# Patient Record
Sex: Female | Born: 1955 | Race: Black or African American | Hispanic: No | Marital: Single | Smoking: Former smoker
Health system: Southern US, Community
[De-identification: ages and names within clinical notes are randomized; demographics above are authoritative.]

## PROBLEM LIST (undated history)

## (undated) DIAGNOSIS — E785 Hyperlipidemia, unspecified: Secondary | ICD-10-CM

## (undated) DIAGNOSIS — J45909 Unspecified asthma, uncomplicated: Secondary | ICD-10-CM

## (undated) DIAGNOSIS — R1310 Dysphagia, unspecified: Secondary | ICD-10-CM

## (undated) DIAGNOSIS — K219 Gastro-esophageal reflux disease without esophagitis: Secondary | ICD-10-CM

## (undated) DIAGNOSIS — J449 Chronic obstructive pulmonary disease, unspecified: Secondary | ICD-10-CM

## (undated) DIAGNOSIS — F329 Major depressive disorder, single episode, unspecified: Secondary | ICD-10-CM

## (undated) DIAGNOSIS — N390 Urinary tract infection, site not specified: Secondary | ICD-10-CM

## (undated) DIAGNOSIS — E079 Disorder of thyroid, unspecified: Secondary | ICD-10-CM

## (undated) DIAGNOSIS — G8250 Quadriplegia, unspecified: Secondary | ICD-10-CM

## (undated) DIAGNOSIS — K3184 Gastroparesis: Secondary | ICD-10-CM

## (undated) DIAGNOSIS — I509 Heart failure, unspecified: Secondary | ICD-10-CM

## (undated) DIAGNOSIS — F419 Anxiety disorder, unspecified: Secondary | ICD-10-CM

## (undated) DIAGNOSIS — E119 Type 2 diabetes mellitus without complications: Secondary | ICD-10-CM

## (undated) DIAGNOSIS — D649 Anemia, unspecified: Secondary | ICD-10-CM

## (undated) HISTORY — PX: OTHER SURGICAL HISTORY: SHX169

---

## 2003-06-21 ENCOUNTER — Other Ambulatory Visit: Payer: Self-pay

## 2005-01-09 ENCOUNTER — Emergency Department: Payer: Self-pay | Admitting: Emergency Medicine

## 2005-02-11 ENCOUNTER — Inpatient Hospital Stay: Payer: Self-pay | Admitting: Internal Medicine

## 2005-12-04 ENCOUNTER — Other Ambulatory Visit: Payer: Self-pay

## 2005-12-04 ENCOUNTER — Emergency Department: Payer: Self-pay | Admitting: Emergency Medicine

## 2006-08-02 ENCOUNTER — Inpatient Hospital Stay: Payer: Self-pay | Admitting: Internal Medicine

## 2007-10-20 ENCOUNTER — Emergency Department: Payer: Self-pay | Admitting: Emergency Medicine

## 2008-11-23 ENCOUNTER — Encounter: Payer: Self-pay | Admitting: Internal Medicine

## 2008-11-23 ENCOUNTER — Ambulatory Visit: Payer: Self-pay | Admitting: Internal Medicine

## 2008-12-03 ENCOUNTER — Ambulatory Visit: Payer: Self-pay | Admitting: Internal Medicine

## 2008-12-14 ENCOUNTER — Encounter: Payer: Self-pay | Admitting: Internal Medicine

## 2009-01-13 ENCOUNTER — Encounter: Payer: Self-pay | Admitting: Internal Medicine

## 2009-02-14 ENCOUNTER — Ambulatory Visit: Payer: Self-pay | Admitting: Internal Medicine

## 2009-03-08 ENCOUNTER — Ambulatory Visit: Payer: Self-pay | Admitting: Internal Medicine

## 2009-03-24 ENCOUNTER — Ambulatory Visit: Payer: Self-pay | Admitting: Internal Medicine

## 2009-04-21 ENCOUNTER — Ambulatory Visit: Payer: Self-pay | Admitting: Internal Medicine

## 2010-11-19 ENCOUNTER — Emergency Department: Payer: Self-pay | Admitting: Emergency Medicine

## 2011-01-16 ENCOUNTER — Inpatient Hospital Stay: Payer: Self-pay | Admitting: Internal Medicine

## 2011-05-05 ENCOUNTER — Inpatient Hospital Stay: Payer: Self-pay | Admitting: Internal Medicine

## 2011-07-11 ENCOUNTER — Inpatient Hospital Stay: Payer: Self-pay | Admitting: Specialist

## 2011-07-17 LAB — CBC WITH DIFFERENTIAL/PLATELET
Basophil %: 0.3 %
Eosinophil %: 0.2 %
HGB: 10.9 g/dL — ABNORMAL LOW (ref 12.0–16.0)
Lymphocyte #: 1.2 10*3/uL (ref 1.0–3.6)
Lymphocyte %: 16.2 %
MCH: 29.5 pg (ref 26.0–34.0)
MCHC: 33 g/dL (ref 32.0–36.0)
Monocyte #: 0.7 10*3/uL (ref 0.0–0.7)
Monocyte %: 9.3 %
Neutrophil #: 5.3 10*3/uL (ref 1.4–6.5)
Neutrophil %: 74 %
RBC: 3.7 10*6/uL — ABNORMAL LOW (ref 3.80–5.20)
RDW: 16.3 % — ABNORMAL HIGH (ref 11.5–14.5)

## 2011-07-17 LAB — BASIC METABOLIC PANEL
Anion Gap: 8 (ref 7–16)
Chloride: 97 mmol/L — ABNORMAL LOW (ref 98–107)
Co2: 31 mmol/L (ref 21–32)
Creatinine: 0.75 mg/dL (ref 0.60–1.30)
Osmolality: 272 (ref 275–301)
Potassium: 3 mmol/L — ABNORMAL LOW (ref 3.5–5.1)

## 2011-07-18 LAB — TROPONIN I: Troponin-I: <0.02 ng/mL

## 2011-07-19 LAB — CBC WITH DIFFERENTIAL/PLATELET
Basophil #: 0.1 10*3/uL (ref 0.0–0.1)
Basophil %: 0.2 %
Basophil %: 0.5 %
Eosinophil #: 0 10*3/uL (ref 0.0–0.7)
Eosinophil %: 0 %
Eosinophil %: 0 %
HCT: 34.9 % — ABNORMAL LOW (ref 35.0–47.0)
HGB: 11 g/dL — ABNORMAL LOW (ref 12.0–16.0)
Lymphocyte %: 1.9 %
Lymphocyte %: 2.6 %
MCH: 29.7 pg (ref 26.0–34.0)
MCHC: 33.3 g/dL (ref 32.0–36.0)
MCV: 89 fL (ref 80–100)
Monocyte #: 0.2 10*3/uL (ref 0.0–0.7)
Monocyte %: 1.5 %
Neutrophil #: 8.4 10*3/uL — ABNORMAL HIGH (ref 1.4–6.5)
Neutrophil %: 95.6 %
Neutrophil %: 95.7 %
Platelet: 270 10*3/uL (ref 150–440)
RBC: 3.91 10*6/uL (ref 3.80–5.20)
RDW: 16.2 % — ABNORMAL HIGH (ref 11.5–14.5)
WBC: 11.1 10*3/uL — ABNORMAL HIGH (ref 3.6–11.0)
WBC: 8.7 10*3/uL (ref 3.6–11.0)

## 2011-07-19 LAB — BASIC METABOLIC PANEL
Anion Gap: 11 (ref 7–16)
BUN: 10 mg/dL (ref 7–18)
Calcium, Total: 8.3 mg/dL — ABNORMAL LOW (ref 8.5–10.1)
Chloride: 87 mmol/L — ABNORMAL LOW (ref 98–107)
Co2: 32 mmol/L (ref 21–32)
Creatinine: 0.56 mg/dL — ABNORMAL LOW (ref 0.60–1.30)
Glucose: 134 mg/dL — ABNORMAL HIGH (ref 65–99)
Potassium: 4.5 mmol/L (ref 3.5–5.1)

## 2011-07-19 LAB — TROPONIN I
Troponin-I: <0.02 ng/mL
Troponin-I: <0.02 ng/mL

## 2011-07-20 LAB — BASIC METABOLIC PANEL
BUN: 15 mg/dL (ref 7–18)
Co2: 32 mmol/L (ref 21–32)
Creatinine: 0.69 mg/dL (ref 0.60–1.30)
EGFR (Non-African Amer.): >60
Glucose: 109 mg/dL — ABNORMAL HIGH (ref 65–99)
Osmolality: 266 (ref 275–301)
Sodium: 132 mmol/L — ABNORMAL LOW (ref 136–145)

## 2011-07-21 LAB — BASIC METABOLIC PANEL
BUN: 17 mg/dL (ref 7–18)
Calcium, Total: 8.7 mg/dL (ref 8.5–10.1)
Chloride: 88 mmol/L — ABNORMAL LOW (ref 98–107)
Creatinine: 0.76 mg/dL (ref 0.60–1.30)
EGFR (Non-African Amer.): >60
Glucose: 95 mg/dL (ref 65–99)
Osmolality: 264 (ref 275–301)
Potassium: 3.6 mmol/L (ref 3.5–5.1)
Sodium: 131 mmol/L — ABNORMAL LOW (ref 136–145)

## 2011-07-22 LAB — BASIC METABOLIC PANEL
Anion Gap: 9 (ref 7–16)
BUN: 14 mg/dL (ref 7–18)
Calcium, Total: 9.1 mg/dL (ref 8.5–10.1)
Chloride: 91 mmol/L — ABNORMAL LOW (ref 98–107)
Co2: 30 mmol/L (ref 21–32)
EGFR (African American): >60
EGFR (Non-African Amer.): >60
Glucose: 98 mg/dL (ref 65–99)
Osmolality: 261 (ref 275–301)
Potassium: 3.8 mmol/L (ref 3.5–5.1)

## 2011-07-26 LAB — EXPECTORATED SPUTUM ASSESSMENT W GRAM STAIN, RFLX TO RESP C

## 2011-10-01 ENCOUNTER — Ambulatory Visit: Payer: Self-pay | Admitting: Unknown Physician Specialty

## 2011-10-02 LAB — PATHOLOGY REPORT

## 2011-10-29 ENCOUNTER — Ambulatory Visit: Payer: Self-pay | Admitting: Unknown Physician Specialty

## 2011-10-29 LAB — CREATININE, SERUM
Creatinine: 0.74 mg/dL (ref 0.60–1.30)
EGFR (Non-African Amer.): >60

## 2012-01-08 ENCOUNTER — Ambulatory Visit: Payer: Self-pay | Admitting: Internal Medicine

## 2012-01-08 LAB — RETICULOCYTES
Absolute Retic Count: 0.0776 10*6/uL (ref 0.024–0.084)
Reticulocyte: 1.9 % — ABNORMAL HIGH (ref 0.5–1.5)

## 2012-01-08 LAB — CBC CANCER CENTER
Basophil #: 0 x10 3/mm (ref 0.0–0.1)
Basophil %: 1 %
Eosinophil %: 5 %
HCT: 37.6 % (ref 35.0–47.0)
HGB: 12.1 g/dL (ref 12.0–16.0)
MCH: 29.6 pg (ref 26.0–34.0)
MCHC: 32.2 g/dL (ref 32.0–36.0)
Neutrophil #: 2.6 x10 3/mm (ref 1.4–6.5)
Neutrophil %: 57.5 %
Platelet: 306 x10 3/mm (ref 150–440)
RBC: 4.08 10*6/uL (ref 3.80–5.20)
WBC: 4.5 x10 3/mm (ref 3.6–11.0)

## 2012-01-08 LAB — FOLATE: Folic Acid: 4.2 ng/mL (ref 3.1–100.0)

## 2012-01-08 LAB — LACTATE DEHYDROGENASE: LDH: 196 U/L (ref 84–246)

## 2012-01-08 LAB — IRON AND TIBC: Iron Saturation: 14 %

## 2012-01-14 ENCOUNTER — Ambulatory Visit: Payer: Self-pay | Admitting: Internal Medicine

## 2012-05-12 ENCOUNTER — Ambulatory Visit: Payer: Self-pay

## 2012-05-12 LAB — CREATININE, SERUM: Creatinine: 0.68 mg/dL (ref 0.60–1.30)

## 2013-01-24 ENCOUNTER — Emergency Department: Payer: Self-pay | Admitting: Emergency Medicine

## 2013-01-24 LAB — CBC
HCT: 32.6 % — ABNORMAL LOW (ref 35.0–47.0)
HGB: 10.9 g/dL — ABNORMAL LOW (ref 12.0–16.0)
MCH: 29.7 pg (ref 26.0–34.0)
MCHC: 33.3 g/dL (ref 32.0–36.0)
MCV: 89 fL (ref 80–100)
Platelet: 319 10*3/uL (ref 150–440)
RBC: 3.66 10*6/uL — ABNORMAL LOW (ref 3.80–5.20)
RDW: 14.6 % — ABNORMAL HIGH (ref 11.5–14.5)

## 2013-01-24 LAB — COMPREHENSIVE METABOLIC PANEL
Albumin: 3.3 g/dL — ABNORMAL LOW (ref 3.4–5.0)
Alkaline Phosphatase: 182 U/L — ABNORMAL HIGH (ref 50–136)
BUN: 4 mg/dL — ABNORMAL LOW (ref 7–18)
Co2: 16 mmol/L — ABNORMAL LOW (ref 21–32)
Potassium: 3 mmol/L — ABNORMAL LOW (ref 3.5–5.1)
SGPT (ALT): 24 U/L (ref 12–78)
Sodium: 139 mmol/L (ref 136–145)

## 2013-01-24 LAB — URINALYSIS, COMPLETE
Bilirubin,UR: NEGATIVE
Ketone: NEGATIVE
Ph: 6 (ref 4.5–8.0)
Specific Gravity: 1.004 (ref 1.003–1.030)

## 2013-03-23 IMAGING — CR DG CHEST 1V PORT
1 series · 1 of 1 positions shown · non-contrast
Comparison: none

REASON FOR EXAM: weakness
COMMENTS:

PROCEDURE:     DXR - DXR PORTABLE CHEST SINGLE VIEW  - May 05, 2011  [DATE]
RESULT:     Comparison: 01/19/2011

[view not recorded]
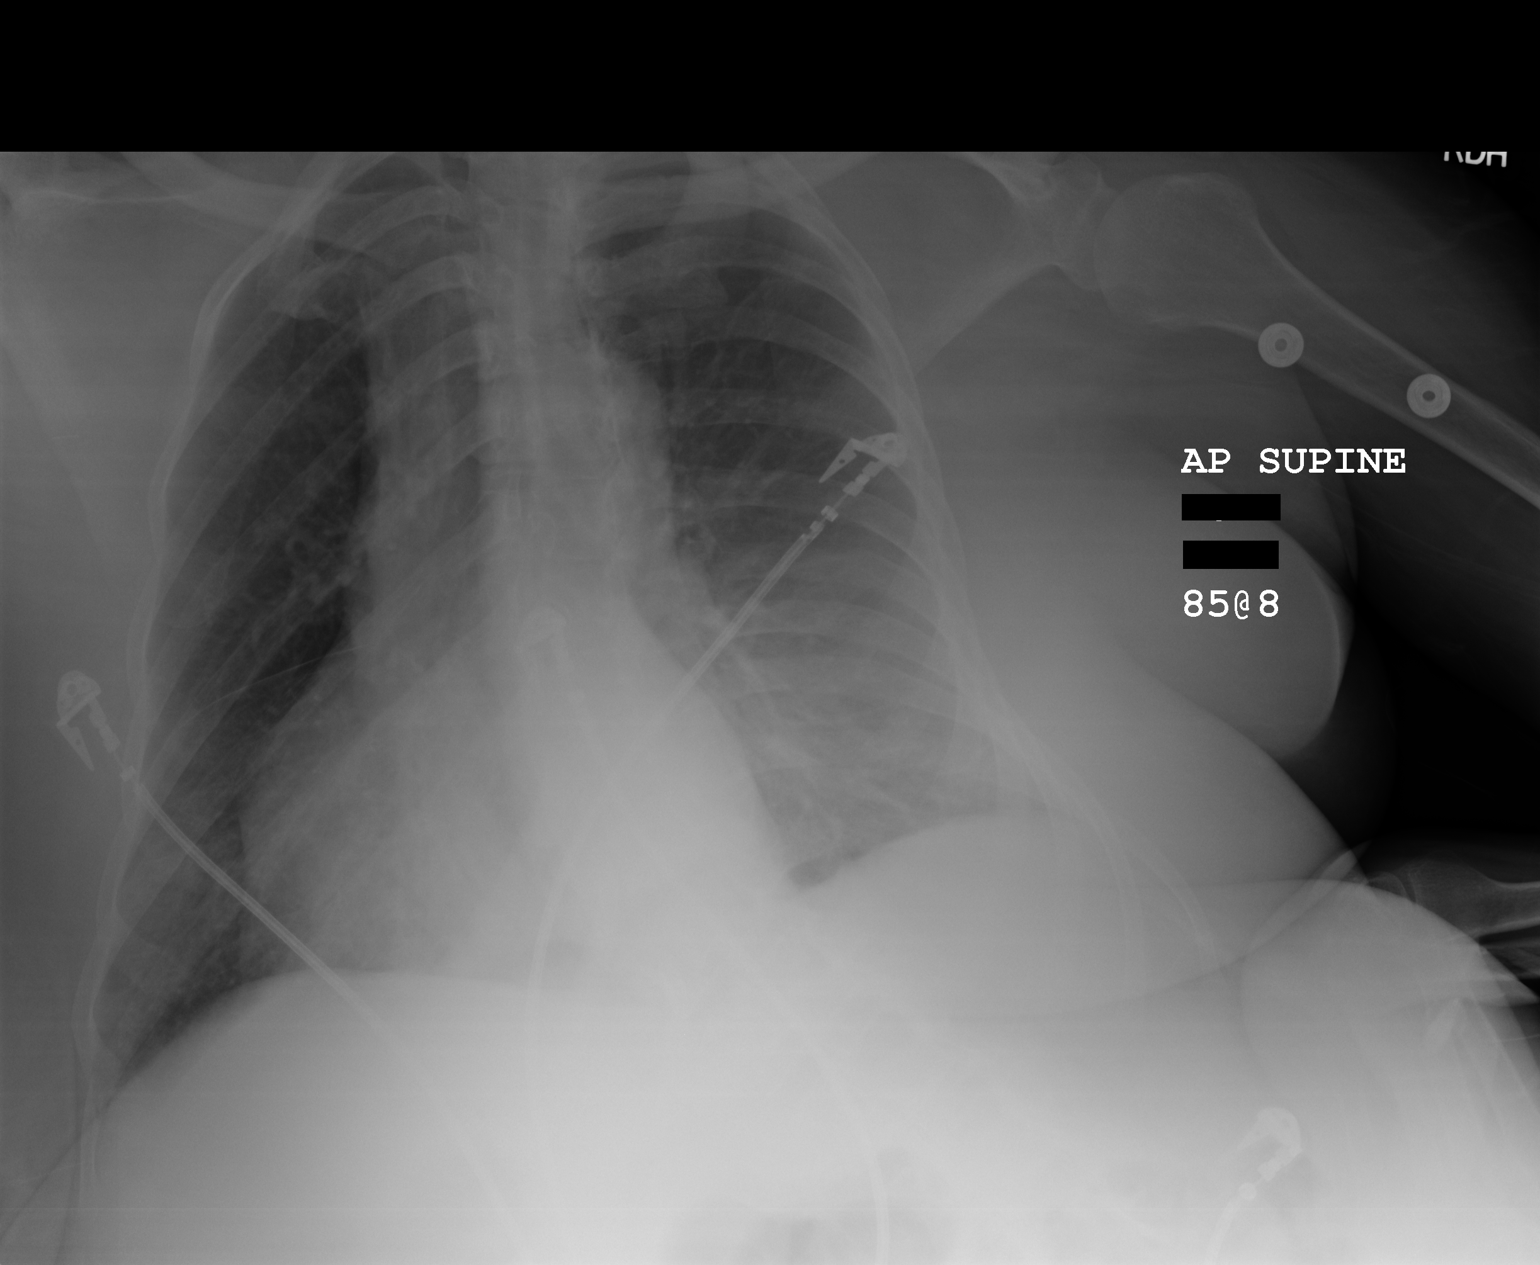

[1 of 1 positions shown; findings below may reference images not displayed]

FINDINGS: Evaluation limited by difficulty with patient positioning and patient
rotation. The heart and mediastinum are stable. Relative increased density
over the mid and lower left lung is felt to be secondary to the patient
rotation and overlying soft tissue. Otherwise, no focal pulmonary opacities
identified.
IMPRESSION: No acute cardiopulmonary disease.

## 2013-05-14 ENCOUNTER — Ambulatory Visit: Payer: Self-pay

## 2014-05-06 ENCOUNTER — Ambulatory Visit: Payer: Self-pay | Admitting: Internal Medicine

## 2014-11-07 NOTE — Consult Note (Signed)
Pt breathing better. no bleeding today, makes 3-4 days without passage of blood.  No bowel movement with laxative pills.  Will give Fleets tonight.. I will sign off, reconsult if needed.  Electronic Signatures: Scot JunElliott, Robert T (MD)  (Signed on 04-Jan-13 19:10)  Authored  Last Updated: 04-Jan-13 19:10 by Scot JunElliott, Robert T (MD)

## 2014-11-07 NOTE — Discharge Summary (Signed)
PATIENT NAME:  Alexis [name], Alexis [name] MR#:  [id number] DATE OF BIRTH:  06-23-1956  DATE OF ADMISSION:  07/11/2011 DATE OF DISCHARGE:  07/23/2011  HISTORY AND PHYSICAL: For a detailed note, please take a look at the History and Physical done by Dr. Auburn BilberryShreyang Patel on admission. Please also look at the detailed Interim Discharge Summary done by Dr. Katharina Caperima Vaickute which covers the hospital course from 07/11/2011 until 07/20/2011. This covers the hospital course from January 4th onwards to the day of discharge, 07/23/2011.   DISCHARGE DIAGNOSES:  1. Anemia secondary to a lower gastrointestinal bleed, now resolved, suspected aspiration pneumonia.  2. Quadriplegia.  3. Obstipation/constipation, chronic for the patient's chronic obstructive pulmonary disease.  4. Congestive heart failure, diastolic dysfunction.   DIET: The patient is being discharged on a mechanical soft diet. The patient is discouraged from taking multiple gulping sips when drinking, 2 to 3 small sips, fewer if necessary, small single bites when feeding, feed slowly. The patient is to sit fully upright when taking p.o.s and remain inclined post meals for at least 45 minutes. The patient is not to lie down after meals.   FOLLOW-UP: Follow up with Dr. Terance HartBronstein in the next 1 to 2 weeks.   DISCHARGE MEDICATIONS:  1. Mucinex 2 tabs every 12 hours.  2. Claritin 10 mg daily.  3. Trazodone 150 mg at bedtime.  4. Fluticasone two sprays to nostril daily.  5. Colace 100 mg b.i.d.  6. Fleet's enema on Mondays, Wednesdays, Fridays.  7. Amitiza  24 mcg, 1 cap b.i.d.  8. Baclofen 10 mg t.i.d.  9. Advair 250/50, 1 puff b.i.d.  10. Oxybutynin 15 mg, 1 tab b.i.d.  11. Bupropion 100 mg, 2 tabs daily.  12. Reglan 10 mg q.i.d. and at bedtime.  13. Zofran 4 mg every 6 hours.  14. Artificial Tears 1 drop to both eyes daily.  15. Ambien 5 mg at bedtime.  16. Omeprazole 40 mg b.i.d.  17. Lasix 20 mg daily.  18. Potassium 20 mEq daily.  19. Augmentin 5 mg  b.i.d. x5 days. 20. Lisinopril 5 mg daily.   CONSULTANTS DURING THE HOSPITAL COURSE: Dr. Kerrin MoBob Elliott from Gastroenterology.   HOSPITAL COURSE: Again, please look at the Interim Discharge Summary done by Dr. Winona LegatoVaickute which covers an extensive hospital course from admission on December 26th until January 4th.  This is only a short interim course.   1. Acute posthemorrhagic anemia: This was thought to be secondary to a GI bleed, likely a lower GI bleed, although the source was unclear. The patient was on Lovenox for deep vein thrombosis prophylaxis when she came. This was discontinued, and the patient was transfused 2 units of packed red blood cells. Hemoglobin has improved since then. She has had no evidence of any further bleeding and hemoglobin is currently stable. She has been able to tolerate p.o. without any worsening anemia.  2. Acute respiratory failure: This was thought to be related to a suspected aspiration pneumonia, also mild underlying congestive heart failure. The patient initially received broad-spectrum antibiotics with Zosyn, Zyvox and Zithromax . Those were narrowed down to now p.o. Augmentin which she will continue for the next five days for treatment for her pneumonia. The patient was also given some diuretics and will resume a low-dose Lasix at 20 mg daily.  3. Chronic obstructive pulmonary disease exacerbation: This was also part of her respiratory failure. She was on triple antibiotic therapy with Zosyn, Zyvox and Zithromax , is now back to baseline. She  will continue her nebulizer treatments and also her Augmentin for her aspiration pneumonia.  4. History of congestive heart failure: This is secondary to diastolic dysfunction. Echocardiogram recently showed a normal LV function, normal valvular abnormalities. She will continue Lasix and maintenance lisinopril for her heart failure.  5. Hyponatremia: This seems to be chronic for the patient.  Her sodium, although, has improved and is  in stable at around 130.  6. Dysphagia: The patient underwent a modified barium swallow which showed no significant dysphagia or reflux. The problem was the patient taking large gulps of fluid and quantities of food. She was placed on small frequent meals and told to take small sips, which seems to have improved the dysphagia.   7. Quadriplegia: This is chronic for the patient. She is on baclofen, which she will continue.  8. Constipation/obstipation: This is secondary to a functional quadriplegia. The patient normally takes Amitiza gets Fleet's enema 3 times a week. She will resume those as this seems to be a chronic problem for the patient. Her last bowel movement was January 4th after she received a Fleet's enema. She has received one today, and this further needs to be followed up.   CODE STATUS:  The patient is a FULL CODE.     TIME SPENT: 40 minutes.   ____________________________ Rolly Pancake. Cherlynn Kaiser, MD vjs:cbb D: 07/23/2011 15:17:28 ET T: 07/23/2011 15:38:22 ET JOB#: [id number]  cc: Rolly Pancake. Cherlynn Kaiser, MD, <Dictator> Teena Irani. Terance Hart, MD [place] Siren MD ELECTRONICALLY SIGNED 07/23/2011 16:57

## 2014-11-07 NOTE — Consult Note (Signed)
Chief Complaint:   Subjective/Chief Complaint 2 bm since yesterday with no reported bleeding. denies abdominal pain.  has some mild nausea but is tolerating clears.   VITAL SIGNS/ANCILLARY NOTES: **Vital Signs.:   01-Jan-13 09:57   Temperature Temperature (F) 97.6   Celsius 36.4   Temperature Source oral   Pulse Pulse 65   Pulse source per Dinamap   Respirations Respirations 18   Systolic BP Systolic BP 509   Diastolic BP (mmHg) Diastolic BP (mmHg) 76   Mean BP 93   BP Source Dinamap   Pulse Ox % Pulse Ox % 99   Pulse Ox Activity Level  At rest   Oxygen Delivery 2L   Brief Assessment:   Cardiac Irregular    Respiratory wheezing  rhonchi  bilateral coarse    Gastrointestinal details normal Soft  Nontender  Nondistended  No masses palpable  Bowel sounds normal   Routine Hem:  01-Jan-13 05:03    WBC (CBC) 7.2   RBC (CBC) 3.70   Hemoglobin (CBC) 10.9   Hematocrit (CBC) 33.0   Platelet Count (CBC) 257   MCV 89   MCH 29.5   MCHC 33.0   RDW 16.3  Routine Chem:  01-Jan-13 05:03    Glucose, Serum 90   BUN 13   Creatinine (comp) 0.75   Sodium, Serum 136   Potassium, Serum 3.0   Chloride, Serum 97   CO2, Serum 31   Calcium (Total), Serum 8.6   Osmolality (calc) 272   eGFR (African American) >60   eGFR (Non-African American) >60   Anion Gap 8  Routine Hem:  01-Jan-13 05:03    Neutrophil % 74.0   Lymphocyte % 16.2   Monocyte % 9.3   Eosinophil % 0.2   Basophil % 0.3   Neutrophil # 5.3   Lymphocyte # 1.2   Monocyte # 0.7   Eosinophil # 0.0   Basophil # 0.0   Radiology Results: Nuclear Med:    30-Dec-12 14:02, GI Blood Loss Study - Nuc Med   GI Blood Loss Study - Nuc Med    REASON FOR EXAM:    lower GI bleed  COMMENTS:       PROCEDURE: NM  - NM GI BLOOD LOSS STUDY  - Jul 15 2011  2:02PM     RESULT: Bleeding Scan    Indication: Lower GI bleed    Comparison: None    Radiopharmaceutical: 24.36 mCi Tc-8mRBC administered intravenously. 3   mL  pyrophosphate was utilized for tagging.    Technique: Dynamic, anterior planar images of the abdomen were obtained     over 60 minutes.    Findings: The examination is slightly limited secondary to the presence   of more than expected free pertechnetate. No abnormal focus of activity   is demonstrated in the bowel to suggest a lower GI bleed. Normal   physiologic biodistribution of the radiotracer is demonstrated throughout   the abdomen. Note is made of radiotracer within theurostomy bag.    IMPRESSION:  No scintigraphic evidence for a lower GI bleed.          Verified By: HJennette Banker M.D., MD   Assessment/Plan:  Assessment/Plan:   Assessment 1) lower gi bleeding, possible anal outlet.  stable, no recurrence the past 24 hours. stable hgb. 2) copd-clubbing of nails noted, however there is also a markedsclerosis of the skin over the fingers.  patient also with h/o some amount of dysphagia.  with these findings some concern  for auto immune/scleroderma.  not noted in h+p.    Plan 1) continue current.  will get ana and scl 70.   Electronic Signatures: Loistine Simas (MD)  (Signed 01-Jan-13 10:06)  Authored: Chief Complaint, VITAL SIGNS/ANCILLARY NOTES, Brief Assessment, Lab Results, Radiology Results, Assessment/Plan   Last Updated: 01-Jan-13 10:06 by Loistine Simas (MD)

## 2014-11-07 NOTE — Consult Note (Signed)
Pt reports no bleeding today, hgb stable.  No bowel movement today.  Will start Dulcolax 10mg  po qhs.  Electronic Signatures: Scot JunElliott, Robert T (MD)  (Signed on 03-Jan-13 18:12)  Authored  Last Updated: 03-Jan-13 18:12 by Scot JunElliott, Robert T (MD)
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
## 2014-11-07 NOTE — Consult Note (Signed)
Pt with no further bleeding, hgb stable, abd non tender.  She is worried that she is not getting enemas any more and will not be able to move her bowels. Will recommend restart the enemas in 2-3 days.  Electronic Signatures: Scot JunElliott, Haruko Mersch T (MD)  (Signed on 02-Jan-13 17:26)  Authored  Last Updated: 02-Jan-13 17:26 by Scot JunElliott, Krishay Faro T (MD)
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
## 2017-03-14 ENCOUNTER — Other Ambulatory Visit: Payer: Self-pay | Admitting: Nurse Practitioner

## 2017-03-14 DIAGNOSIS — K5909 Other constipation: Secondary | ICD-10-CM

## 2017-03-14 DIAGNOSIS — R140 Abdominal distension (gaseous): Secondary | ICD-10-CM

## 2017-03-22 ENCOUNTER — Ambulatory Visit: Payer: Self-pay

## 2017-03-25 ENCOUNTER — Ambulatory Visit
Admission: RE | Admit: 2017-03-25 | Discharge: 2017-03-25 | Disposition: A | Discharge status: Home / Self Care | Payer: Medicare Other | Source: Ambulatory Visit | Attending: Nurse Practitioner | Admitting: Nurse Practitioner

## 2017-03-25 DIAGNOSIS — K5909 Other constipation: Secondary | ICD-10-CM

## 2017-03-25 DIAGNOSIS — R140 Abdominal distension (gaseous): Secondary | ICD-10-CM | POA: Diagnosis present

## 2017-03-25 DIAGNOSIS — N261 Atrophy of kidney (terminal): Secondary | ICD-10-CM | POA: Insufficient documentation

## 2017-03-25 DIAGNOSIS — K760 Fatty (change of) liver, not elsewhere classified: Secondary | ICD-10-CM | POA: Diagnosis not present

## 2017-08-06 ENCOUNTER — Encounter: Payer: Self-pay | Admitting: Emergency Medicine

## 2017-08-06 ENCOUNTER — Observation Stay
Admission: EM | Admit: 2017-08-06 | Discharge: 2017-08-08 | Disposition: A | Discharge status: Skilled Nursing Facility | Payer: Medicare Other | Attending: Family Medicine | Admitting: Family Medicine

## 2017-08-06 ENCOUNTER — Other Ambulatory Visit: Payer: Self-pay

## 2017-08-06 DIAGNOSIS — K3184 Gastroparesis: Secondary | ICD-10-CM | POA: Diagnosis not present

## 2017-08-06 DIAGNOSIS — Z881 Allergy status to other antibiotic agents status: Secondary | ICD-10-CM | POA: Insufficient documentation

## 2017-08-06 DIAGNOSIS — E785 Hyperlipidemia, unspecified: Secondary | ICD-10-CM | POA: Insufficient documentation

## 2017-08-06 DIAGNOSIS — Z993 Dependence on wheelchair: Secondary | ICD-10-CM | POA: Diagnosis not present

## 2017-08-06 DIAGNOSIS — L8990 Pressure ulcer of unspecified site, unspecified stage: Secondary | ICD-10-CM

## 2017-08-06 DIAGNOSIS — K219 Gastro-esophageal reflux disease without esophagitis: Secondary | ICD-10-CM | POA: Diagnosis not present

## 2017-08-06 DIAGNOSIS — N319 Neuromuscular dysfunction of bladder, unspecified: Secondary | ICD-10-CM | POA: Diagnosis not present

## 2017-08-06 DIAGNOSIS — I509 Heart failure, unspecified: Secondary | ICD-10-CM | POA: Insufficient documentation

## 2017-08-06 DIAGNOSIS — Z978 Presence of other specified devices: Secondary | ICD-10-CM | POA: Insufficient documentation

## 2017-08-06 DIAGNOSIS — L89892 Pressure ulcer of other site, stage 2: Secondary | ICD-10-CM | POA: Insufficient documentation

## 2017-08-06 DIAGNOSIS — E1143 Type 2 diabetes mellitus with diabetic autonomic (poly)neuropathy: Secondary | ICD-10-CM | POA: Insufficient documentation

## 2017-08-06 DIAGNOSIS — E039 Hypothyroidism, unspecified: Secondary | ICD-10-CM | POA: Diagnosis not present

## 2017-08-06 DIAGNOSIS — F419 Anxiety disorder, unspecified: Secondary | ICD-10-CM | POA: Diagnosis not present

## 2017-08-06 DIAGNOSIS — Z936 Other artificial openings of urinary tract status: Secondary | ICD-10-CM | POA: Diagnosis not present

## 2017-08-06 DIAGNOSIS — G8250 Quadriplegia, unspecified: Secondary | ICD-10-CM | POA: Diagnosis not present

## 2017-08-06 DIAGNOSIS — J449 Chronic obstructive pulmonary disease, unspecified: Secondary | ICD-10-CM | POA: Insufficient documentation

## 2017-08-06 DIAGNOSIS — N390 Urinary tract infection, site not specified: Secondary | ICD-10-CM | POA: Diagnosis not present

## 2017-08-06 DIAGNOSIS — Z79899 Other long term (current) drug therapy: Secondary | ICD-10-CM | POA: Diagnosis not present

## 2017-08-06 DIAGNOSIS — A419 Sepsis, unspecified organism: Secondary | ICD-10-CM | POA: Diagnosis not present

## 2017-08-06 DIAGNOSIS — I110 Hypertensive heart disease with heart failure: Secondary | ICD-10-CM | POA: Insufficient documentation

## 2017-08-06 DIAGNOSIS — F329 Major depressive disorder, single episode, unspecified: Secondary | ICD-10-CM | POA: Diagnosis not present

## 2017-08-06 HISTORY — DX: Heart failure, unspecified: I50.9

## 2017-08-06 HISTORY — DX: Major depressive disorder, single episode, unspecified: F32.9

## 2017-08-06 HISTORY — DX: Gastro-esophageal reflux disease without esophagitis: K21.9

## 2017-08-06 HISTORY — DX: Disorder of thyroid, unspecified: E07.9

## 2017-08-06 HISTORY — DX: Gastroparesis: K31.84

## 2017-08-06 HISTORY — DX: Hyperlipidemia, unspecified: E78.5

## 2017-08-06 HISTORY — DX: Dysphagia, unspecified: R13.10

## 2017-08-06 HISTORY — DX: Quadriplegia, unspecified: G82.50

## 2017-08-06 HISTORY — DX: Urinary tract infection, site not specified: N39.0

## 2017-08-06 HISTORY — DX: Anxiety disorder, unspecified: F41.9

## 2017-08-06 HISTORY — DX: Anemia, unspecified: D64.9

## 2017-08-06 HISTORY — DX: Type 2 diabetes mellitus without complications: E11.9

## 2017-08-06 HISTORY — DX: Chronic obstructive pulmonary disease, unspecified: J44.9

## 2017-08-06 HISTORY — DX: Unspecified asthma, uncomplicated: J45.909

## 2017-08-06 LAB — CBC WITH DIFFERENTIAL/PLATELET
Basophils Absolute: 0.1 10*3/uL (ref 0–0.1)
Basophils Relative: 1 %
EOS ABS: 0.3 10*3/uL (ref 0–0.7)
EOS PCT: 4 %
HCT: 39.8 % (ref 35.0–47.0)
Hemoglobin: 12.8 g/dL (ref 12.0–16.0)
LYMPHS ABS: 1.5 10*3/uL (ref 1.0–3.6)
LYMPHS PCT: 21 %
MCH: 29.6 pg (ref 26.0–34.0)
MCHC: 32.2 g/dL (ref 32.0–36.0)
MCV: 92 fL (ref 80.0–100.0)
MONO ABS: 0.5 10*3/uL (ref 0.2–0.9)
MONOS PCT: 6 %
Neutro Abs: 5 10*3/uL (ref 1.4–6.5)
Neutrophils Relative %: 68 %
PLATELETS: 292 10*3/uL (ref 150–440)
RBC: 4.33 MIL/uL (ref 3.80–5.20)
RDW: 14 % (ref 11.5–14.5)
WBC: 7.3 10*3/uL (ref 3.6–11.0)

## 2017-08-06 LAB — COMPREHENSIVE METABOLIC PANEL
ALBUMIN: 4.1 g/dL (ref 3.5–5.0)
ALT: 27 U/L (ref 14–54)
AST: 30 U/L (ref 15–41)
Alkaline Phosphatase: 193 U/L — ABNORMAL HIGH (ref 38–126)
Anion gap: 11 (ref 5–15)
BUN: 15 mg/dL (ref 6–20)
CO2: 19 mmol/L — AB (ref 22–32)
CREATININE: 0.85 mg/dL (ref 0.44–1.00)
Calcium: 9.7 mg/dL (ref 8.9–10.3)
Chloride: 108 mmol/L (ref 101–111)
GFR calc Af Amer: >60 mL/min (ref >60)
GLUCOSE: 152 mg/dL — AB (ref 65–99)
POTASSIUM: 3.9 mmol/L (ref 3.5–5.1)
SODIUM: 138 mmol/L (ref 135–145)
Total Bilirubin: 0.6 mg/dL (ref 0.3–1.2)
Total Protein: 8.3 g/dL — ABNORMAL HIGH (ref 6.5–8.1)

## 2017-08-06 LAB — URINALYSIS, COMPLETE (UACMP) WITH MICROSCOPIC
Bilirubin Urine: NEGATIVE
Glucose, UA: NEGATIVE mg/dL
KETONES UR: NEGATIVE mg/dL
Nitrite: POSITIVE — AB
PROTEIN: NEGATIVE mg/dL
Specific Gravity, Urine: 1.014 (ref 1.005–1.030)
pH: 6 (ref 5.0–8.0)

## 2017-08-06 LAB — LACTIC ACID, PLASMA
Lactic Acid, Venous: 2.1 mmol/L (ref 0.5–1.9)
Lactic Acid, Venous: 1 mmol/L (ref 0.5–1.9)

## 2017-08-06 LAB — MRSA PCR SCREENING: MRSA by PCR: POSITIVE — AB

## 2017-08-06 MED ORDER — ATORVASTATIN CALCIUM 20 MG PO TABS
40.0000 mg | ORAL_TABLET | Freq: Every day | ORAL | Status: DC
  Administered 2017-08-06 – 2017-08-08 (×3): 40 mg via ORAL
  Filled 2017-08-06 (×3): qty 2

## 2017-08-06 MED ORDER — DOCUSATE SODIUM 100 MG PO CAPS: 100.0000 mg | ORAL_CAPSULE | Freq: Two times a day (BID) | ORAL | Status: DC | PRN

## 2017-08-06 MED ORDER — MONTELUKAST SODIUM 10 MG PO TABS
10.0000 mg | ORAL_TABLET | Freq: Every day | ORAL | Status: DC
  Administered 2017-08-06 – 2017-08-07 (×2): 10 mg via ORAL
  Filled 2017-08-06 (×2): qty 1

## 2017-08-06 MED ORDER — CYCLOSPORINE 0.05 % OP EMUL
1.0000 [drp] | Freq: Two times a day (BID) | OPHTHALMIC | Status: DC
  Administered 2017-08-06 – 2017-08-08 (×4): 1 [drp] via OPHTHALMIC
  Filled 2017-08-06 (×5): qty 1

## 2017-08-06 MED ORDER — IPRATROPIUM-ALBUTEROL 0.5-2.5 (3) MG/3ML IN SOLN
3.0000 mL | Freq: Four times a day (QID) | RESPIRATORY_TRACT | Status: DC | PRN
  Filled 2017-08-06: qty 3

## 2017-08-06 MED ORDER — HEPARIN SODIUM (PORCINE) 5000 UNIT/ML IJ SOLN
5000.0000 [IU] | Freq: Three times a day (TID) | INTRAMUSCULAR | Status: DC
  Administered 2017-08-06 – 2017-08-08 (×6): 5000 [IU] via SUBCUTANEOUS
  Filled 2017-08-06 (×6): qty 1

## 2017-08-06 MED ORDER — MELATONIN 5 MG PO TABS
2.5000 mg | ORAL_TABLET | Freq: Every day | ORAL | Status: DC
  Administered 2017-08-06 – 2017-08-07 (×2): 5 mg via ORAL
  Filled 2017-08-06 (×3): qty 1

## 2017-08-06 MED ORDER — ALUM & MAG HYDROXIDE-SIMETH 200-200-20 MG/5ML PO SUSP: 30.0000 mL | Freq: Four times a day (QID) | ORAL | Status: DC | PRN

## 2017-08-06 MED ORDER — SENNOSIDES-DOCUSATE SODIUM 8.6-50 MG PO TABS
2.0000 | ORAL_TABLET | Freq: Two times a day (BID) | ORAL | Status: DC
  Administered 2017-08-06 – 2017-08-08 (×4): 2 via ORAL
  Filled 2017-08-06 (×4): qty 2

## 2017-08-06 MED ORDER — OLOPATADINE HCL 0.1 % OP SOLN
1.0000 [drp] | Freq: Two times a day (BID) | OPHTHALMIC | Status: DC
  Administered 2017-08-06 – 2017-08-08 (×4): 1 [drp] via OPHTHALMIC
  Filled 2017-08-06: qty 5

## 2017-08-06 MED ORDER — SODIUM CHLORIDE 0.9 % IV BOLUS (SEPSIS)
500.0000 mL | Freq: Once | INTRAVENOUS | Status: AC
  Administered 2017-08-06: 500 mL via INTRAVENOUS

## 2017-08-06 MED ORDER — FOLIC ACID 1 MG PO TABS
1.0000 mg | ORAL_TABLET | Freq: Every day | ORAL | Status: DC
  Administered 2017-08-06 – 2017-08-08 (×3): 1 mg via ORAL
  Filled 2017-08-06 (×3): qty 1

## 2017-08-06 MED ORDER — CEFTRIAXONE SODIUM IN DEXTROSE 20 MG/ML IV SOLN
INTRAVENOUS | Status: AC
  Administered 2017-08-06: 1 g via INTRAVENOUS
  Filled 2017-08-06: qty 50

## 2017-08-06 MED ORDER — BISACODYL 10 MG RE SUPP: 10.0000 mg | RECTAL | Status: DC | PRN

## 2017-08-06 MED ORDER — PANTOPRAZOLE SODIUM 40 MG PO TBEC
40.0000 mg | DELAYED_RELEASE_TABLET | Freq: Every day | ORAL | Status: DC
  Administered 2017-08-06 – 2017-08-08 (×3): 40 mg via ORAL
  Filled 2017-08-06 (×3): qty 1

## 2017-08-06 MED ORDER — SODIUM CHLORIDE 0.9 % IV SOLN
INTRAVENOUS | Status: DC
  Administered 2017-08-06 – 2017-08-07 (×2): via INTRAVENOUS

## 2017-08-06 MED ORDER — CEFTRIAXONE SODIUM IN DEXTROSE 20 MG/ML IV SOLN
1.0000 g | Freq: Once | INTRAVENOUS | Status: AC
  Administered 2017-08-06: 1 g via INTRAVENOUS

## 2017-08-06 MED ORDER — BACLOFEN 10 MG PO TABS
10.0000 mg | ORAL_TABLET | Freq: Four times a day (QID) | ORAL | Status: DC
  Administered 2017-08-06 – 2017-08-08 (×8): 10 mg via ORAL
  Filled 2017-08-06 (×10): qty 1

## 2017-08-06 MED ORDER — BUPROPION HCL ER (XL) 150 MG PO TB24
300.0000 mg | ORAL_TABLET | Freq: Every day | ORAL | Status: DC
  Administered 2017-08-06 – 2017-08-07 (×2): 300 mg via ORAL
  Administered 2017-08-08: 150 mg via ORAL
  Filled 2017-08-06 (×3): qty 2

## 2017-08-06 MED ORDER — OXYBUTYNIN CHLORIDE ER 5 MG PO TB24
15.0000 mg | ORAL_TABLET | Freq: Every day | ORAL | Status: DC
  Administered 2017-08-06 – 2017-08-07 (×2): 15 mg via ORAL
  Filled 2017-08-06 (×2): qty 3

## 2017-08-06 MED ORDER — LEVOTHYROXINE SODIUM 50 MCG PO TABS
50.0000 ug | ORAL_TABLET | Freq: Every day | ORAL | Status: DC
  Administered 2017-08-07 – 2017-08-08 (×2): 50 ug via ORAL
  Filled 2017-08-06 (×2): qty 1

## 2017-08-06 MED ORDER — POLYVINYL ALCOHOL 1.4 % OP SOLN
1.0000 [drp] | Freq: Three times a day (TID) | OPHTHALMIC | Status: DC
  Administered 2017-08-06 – 2017-08-08 (×7): 1 [drp] via OPHTHALMIC
  Filled 2017-08-06: qty 15

## 2017-08-06 MED ORDER — TIOTROPIUM BROMIDE MONOHYDRATE 18 MCG IN CAPS
18.0000 ug | ORAL_CAPSULE | Freq: Every day | RESPIRATORY_TRACT | Status: DC
  Administered 2017-08-06 – 2017-08-08 (×3): 18 ug via RESPIRATORY_TRACT
  Filled 2017-08-06: qty 5

## 2017-08-06 MED ORDER — ARFORMOTEROL TARTRATE 15 MCG/2ML IN NEBU
15.0000 ug | INHALATION_SOLUTION | Freq: Two times a day (BID) | RESPIRATORY_TRACT | Status: DC
  Administered 2017-08-06 – 2017-08-08 (×4): 15 ug via RESPIRATORY_TRACT
  Filled 2017-08-06 (×5): qty 2

## 2017-08-06 MED ORDER — ACETAMINOPHEN 500 MG PO TABS
500.0000 mg | ORAL_TABLET | Freq: Four times a day (QID) | ORAL | Status: DC | PRN
  Administered 2017-08-06 – 2017-08-07 (×2): 500 mg via ORAL
  Filled 2017-08-06 (×3): qty 1

## 2017-08-06 MED ORDER — DEXTROSE 5 % IV SOLN
1.0000 g | INTRAVENOUS | Status: DC
  Administered 2017-08-07 – 2017-08-08 (×2): 1 g via INTRAVENOUS
  Filled 2017-08-06 (×4): qty 10

## 2017-08-06 MED ORDER — GUAIFENESIN ER 600 MG PO TB12
600.0000 mg | ORAL_TABLET | Freq: Two times a day (BID) | ORAL | Status: DC
  Administered 2017-08-06 – 2017-08-08 (×4): 600 mg via ORAL
  Filled 2017-08-06 (×4): qty 1

## 2017-08-06 MED ORDER — MAGNESIUM OXIDE 400 (241.3 MG) MG PO TABS
400.0000 mg | ORAL_TABLET | Freq: Every day | ORAL | Status: DC
  Administered 2017-08-06 – 2017-08-08 (×3): 400 mg via ORAL
  Filled 2017-08-06 (×3): qty 1

## 2017-08-06 MED ORDER — POLYETHYLENE GLYCOL 3350 17 G PO PACK
1.0000 | PACK | Freq: Every day | ORAL | Status: DC
  Administered 2017-08-06 – 2017-08-08 (×3): 17 g via ORAL
  Filled 2017-08-06 (×3): qty 1

## 2017-08-06 MED ORDER — LORATADINE 10 MG PO TABS
10.0000 mg | ORAL_TABLET | Freq: Every day | ORAL | Status: DC
  Administered 2017-08-06 – 2017-08-08 (×3): 10 mg via ORAL
  Filled 2017-08-06 (×3): qty 1

## 2017-08-06 NOTE — ED Notes (Signed)
Pt transported to room 214 

## 2017-08-06 NOTE — ED Notes (Signed)
ED Provider at bedside. 

## 2017-08-06 NOTE — ED Notes (Addendum)
Urostomy bag not filling with urine, urine stuck in tubing. States this began occurring yesterday. Pt states urostomy bag changed last week, and drainage bag changed a few weeks ago. Drainage bag changed, urine draining without difficulty at this time. Urine light yellow with mild sediment.

## 2017-08-06 NOTE — ED Notes (Signed)
Date and time results received: 08/06/17 11:39 AM  (use smartphrase ".now" to insert current time)  Test: lactic acid Critical Value: 2.1   Name of Provider Notified: Dr. Governor Rooksebecca Lord  Orders Received? Or Actions Taken?:

## 2017-08-06 NOTE — Progress Notes (Signed)
Family Meeting Note  Advance Directive:yes  Today a meeting took place with the Patient.  The following clinical team members were present during this meeting:MD  The following were discussed:Patient's diagnosis: UTI, Debbora DusQuadreplegia , Patient's progosis: Unable to determine and Goals for treatment: Full Code  Additional follow-up to be provided: PCP  Time spent during discussion:20 minutes  Altamese DillingVaibhavkumar Shanyn Preisler, MD
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
## 2017-08-06 NOTE — ED Triage Notes (Signed)
[facility].  Patient has stoma for urinary drainage.  Sent to ED because stoma is not draining any urine this morning.

## 2017-08-06 NOTE — ED Notes (Signed)
First Nurse Note:  Patient [facility] with complaints of stoma not draining.  Smells strongly of urine.

## 2017-08-06 NOTE — ED Notes (Signed)
Family at bedside. 

## 2017-08-06 NOTE — Progress Notes (Signed)
Pt arrived to room 214 with mobilized wheelchair (stored in pt.'s bathroom). Pt is alert and oriented, pt arrived to the room with belongs of having a pocketbook with two set of glasses, $12, clothes, shoes , and lift pad. Skin was assess with Erika Hopkins, RNChristene Slates. Both RN's noted that pt has a stage II on her left palm of her foot- foam dressing was applied. Pt.'s buttocks looked that she had pressure ulcers that were healed, sacrum foam was applied. Plan of care was a dressed with patient and [facility]. Will continue to monitor pt.   Briena Swingler [place]

## 2017-08-06 NOTE — ED Notes (Signed)
Pt covered with multiple warm blankets.

## 2017-08-06 NOTE — ED Notes (Addendum)
Report to to Leotis ShamesLauren, RN on 2C.  Pt drove herself up to hospital room via electric wheelchair. Pt accompanied by Lenord FellersYessica, EDT. All of belongings with patient.
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
## 2017-08-07 DIAGNOSIS — L8990 Pressure ulcer of unspecified site, unspecified stage: Secondary | ICD-10-CM

## 2017-08-07 DIAGNOSIS — N390 Urinary tract infection, site not specified: Secondary | ICD-10-CM | POA: Diagnosis not present

## 2017-08-07 LAB — BASIC METABOLIC PANEL
Anion gap: 6 (ref 5–15)
BUN: 18 mg/dL (ref 6–20)
CALCIUM: 8.8 mg/dL — AB (ref 8.9–10.3)
CO2: 23 mmol/L (ref 22–32)
CREATININE: 0.74 mg/dL (ref 0.44–1.00)
Chloride: 110 mmol/L (ref 101–111)
GFR calc non Af Amer: >60 mL/min (ref >60)
Glucose, Bld: 105 mg/dL — ABNORMAL HIGH (ref 65–99)
Potassium: 4 mmol/L (ref 3.5–5.1)
Sodium: 139 mmol/L (ref 135–145)

## 2017-08-07 LAB — CBC
HCT: 34.5 % — ABNORMAL LOW (ref 35.0–47.0)
Hemoglobin: 11.4 g/dL — ABNORMAL LOW (ref 12.0–16.0)
MCH: 30 pg (ref 26.0–34.0)
MCHC: 32.9 g/dL (ref 32.0–36.0)
MCV: 91.3 fL (ref 80.0–100.0)
PLATELETS: 276 10*3/uL (ref 150–440)
RBC: 3.78 MIL/uL — ABNORMAL LOW (ref 3.80–5.20)
RDW: 13.9 % (ref 11.5–14.5)
WBC: 5.3 10*3/uL (ref 3.6–11.0)

## 2017-08-07 MED ORDER — MUPIROCIN 2 % EX OINT
1.0000 "application " | TOPICAL_OINTMENT | Freq: Two times a day (BID) | CUTANEOUS | Status: DC
  Administered 2017-08-07 – 2017-08-08 (×3): 1 via NASAL
  Filled 2017-08-07: qty 22

## 2017-08-07 MED ORDER — TRAZODONE HCL 50 MG PO TABS
50.0000 mg | ORAL_TABLET | Freq: Every day | ORAL | Status: DC
  Administered 2017-08-07: 50 mg via ORAL
  Filled 2017-08-07: qty 1

## 2017-08-07 MED ORDER — ADULT MULTIVITAMIN W/MINERALS CH
1.0000 | ORAL_TABLET | Freq: Every day | ORAL | Status: DC
  Administered 2017-08-07 – 2017-08-08 (×2): 1 via ORAL
  Filled 2017-08-07 (×2): qty 1

## 2017-08-07 MED ORDER — JUVEN PO PACK
2.0000 | PACK | Freq: Two times a day (BID) | ORAL | Status: DC
  Administered 2017-08-07: 2 via ORAL

## 2017-08-07 MED ORDER — CHLORHEXIDINE GLUCONATE CLOTH 2 % EX PADS
6.0000 | MEDICATED_PAD | Freq: Every day | CUTANEOUS | Status: DC
  Administered 2017-08-07 – 2017-08-08 (×2): 6 via TOPICAL

## 2017-08-07 NOTE — Care Management Obs Status (Signed)
MEDICARE OBSERVATION STATUS NOTIFICATION   Patient Details  Name: Alexis MurdochCarolyn L Mcquinn MRN: [id number] Date of Birth: May 15, 1956   Medicare Observation Status Notification Given:       Chapman FitchBOWEN, Adlene Adduci T, RN 08/07/2017, 11:07 AM

## 2017-08-07 NOTE — Care Management Obs Status (Signed)
MEDICARE OBSERVATION STATUS NOTIFICATION   Patient Details  Name: Alexis MurdochCarolyn L Deleo MRN: [id number] Date of Birth: 1955/11/28   Medicare Observation Status Notification Given:  Yes    Chapman FitchBOWEN, Anastasiya Gowin T, RN 08/07/2017, 11:08 AM
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
## 2017-08-07 NOTE — Progress Notes (Signed)
[facility] [place] [facility]   PATIENT NAME: Alexis [name]    MR#:  [id number]  DATE OF BIRTH:  [birth year]  SUBJECTIVE:seen at bedside.  Denies any complaints.  foley is draining clear yellow urine.  Waiting for urine cultures to adjust antibiotics.  Changed to observation status.  CHIEF COMPLAINT:   Chief Complaint  Patient presents with  . urine not draining    REVIEW OF SYSTEMS:   ROS CONSTITUTIONAL: No fever, fatigue or weakness.  EYES: No blurred or double vision.  EARS, NOSE, AND THROAT: No tinnitus or ear pain.  RESPIRATORY: No cough, shortness of breath, wheezing or hemoptysis.  CARDIOVASCULAR: No chest pain, orthopnea, edema.  GASTROINTESTINAL: No nausea, vomiting, diarrhea or abdominal pain.  GENITOURINARY: No dysuria, hematuria.  ENDOCRINE: No polyuria, nocturia,  HEMATOLOGY: No anemia, easy bruising or bleeding SKIN: No rash or lesion. MUSCULOSKELETAL: No joint pain or arthritis.   NEUROLOGIC: No tingling, numbness, weakness.  PSYCHIATRY: No anxiety or depression.   DRUG ALLERGIES:   Allergies  Allergen Reactions  . Ciprofloxacin Rash  . Macrodantin [Nitrofurantoin] Rash    VITALS:  Blood pressure 119/70, pulse 61, temperature 97.9 F (36.6 C), resp. rate 16, height 5\' 1"  (1.549 m), weight 108.9 kg (240 lb), SpO2 100 %.  PHYSICAL EXAMINATION:  GENERAL:  62 y.o.-year-old patient lying in the bed with no acute distress.  EYES: Pupils equal, round, . No scleral icterus. Extraocular muscles intact.  HEENT: Head atraumatic, normocephalic. Oropharynx and nasopharynx clear.  NECK:  Supple, no jugular venous distention. No thyroid enlargement, no tenderness.  LUNGS: Normal breath sounds bilaterally, no wheezing, rales,rhonchi or crepitation. No use of accessory muscles of respiration.  CARDIOVASCULAR: S1, S2 normal. No murmurs, rubs, or gallops.  ABDOMEN: Soft, nontender, nondistended. Bowel sounds present. No organomegaly or mass.   EXTREMITIES: No pedal edema, cyanosis, or clubbing.  NEUROLOGIC:  quadriplegia.  PSYCHIATRIC: The patient is alert and oriented x 3.  SKIN: Stage II pressure ulcer on the left palm and also left foot foam dressing applied.  Patient also had pressure ulcers which are healed  In buttocks.   LABORATORY PANEL:   CBC Recent Labs  Lab 08/07/17 0355  WBC 5.3  HGB 11.4*  HCT 34.5*  PLT 276   ------------------------------------------------------------------------------------------------------------------  Chemistries  Recent Labs  Lab 08/06/17 1033 08/07/17 0355  NA 138 139  K 3.9 4.0  CL 108 110  CO2 19* 23  GLUCOSE 152* 105*  BUN 15 18  CREATININE 0.85 0.74  CALCIUM 9.7 8.8*  AST 30  --   ALT 27  --   ALKPHOS 193*  --   BILITOT 0.6  --    ------------------------------------------------------------------------------------------------------------------  Cardiac Enzymes No results for input(s): TROPONINI in the last 168 hours. ------------------------------------------------------------------------------------------------------------------  RADIOLOGY:  No results found.  EKG:   Orders placed or performed in visit on 12/04/05  . EKG 12-Lead    ASSESSMENT AND PLAN:   Sepsis with UTI: Elevated lactic acid on admission.  Follow urine cultures, continue Rocephin, no fever or no WBC.  [facility].  I am waiting for urine cultures to change antibiotics and then [facility].IV Rocephin, encourage p.o. intake.  Have neurogenic bladder. #2 history of quadriplegia, muscle spasticity, profuse sweating recently, patient supposed to see neurologist yesterday but was sent in here because of malfunctioning indwelling Foley with Foley not draining urine.  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  Supposed  to see Dr. Malvin Johns yesterday but was sent to emergency room. 3.  COPD: No  wheezing.  #4 patient  In contact isolation because of  Positive  MRSA screen. All the records are reviewed and case discussed with Care Management/Social Workerr. Management plans discussed with the patient, family and they are in agreement.  CODE STATUS: Full Code  TOTAL TIME TAKING CARE OF THIS PATIENT: .   POSSIBLE D/C IN 1-2DAYS, DEPENDING ON CLINICAL CONDITION.   Katha Hamming M.D on 08/07/2017 at 9:33 AM  Between 7am to 6pm - Pager - [phone]  After 6pm go to [email or web address] - password [email or web address] [facility]  [facility]  Office  [phone]  CC: Primary care physician; Derwood Kaplan, MD   Note: This dictation was prepared with Dragon dictation along with smaller phrase technology. Any transcriptional errors that result from this process are unintentional.
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
## 2017-08-07 NOTE — Care Management CC44 (Signed)
Condition Code 44 Documentation Completed  Patient Details  Name: Alexis MurdochCarolyn L [name] MRN: [id number] Date of Birth: [birth year]   Condition Code 44 given:  Yes Patient signature on Condition Code 44 notice:  Yes Documentation of 2 MD's agreement:  Yes Code 44 added to claim:  Yes    Chapman FitchBOWEN, Dalen [place] T, RN 08/07/2017, 11:08 AM

## 2017-08-07 NOTE — NC FL2 (Signed)
MEDICAID FL2 LEVEL OF CARE SCREENING TOOL     IDENTIFICATION  Patient Name: Alexis [name] Birthdate: 04/07/1956 Sex: female Admission Date (Current Location): 08/06/2017  [place]   Facility and Address:  [facility], [address], [place], [place] [address]      Provider Number: [id number]  Attending Physician Name and Address:  Katha HammingKonidena, Snehalatha, MD  Relative Name and Phone Number:       Current Level of Care: Hospital Recommended Level of Care: Nursing Facility Prior Approval Number:    Date Approved/Denied:   PASRR Number:    Discharge Plan: SNF    Current Diagnoses: Patient Active Problem List   Diagnosis Date Noted  . Pressure injury of skin 08/07/2017  . Acute lower UTI 08/06/2017  . UTI (urinary tract infection) 08/06/2017    Orientation RESPIRATION BLADDER Height & Weight     Self, Time, Situation, Place  Normal Urostomy Weight: 240 lb (108.9 kg) Height:  5\' 1"  (154.9 cm)  BEHAVIORAL SYMPTOMS/MOOD NEUROLOGICAL BOWEL NUTRITION STATUS      Continent Diet(See DC summary)  AMBULATORY STATUS COMMUNICATION OF NEEDS Skin   Total Care Verbally PU Stage and Appropriate Care   PU Stage 2 Dressing: (Stage II -  Partial thickness loss of dermis presenting as a shallow open ulcer with a red, pink wound bed without slough.)                   Personal Care Assistance Level of Assistance  Bathing, Feeding, Dressing Bathing Assistance: Maximum assistance Feeding assistance: Limited assistance Dressing Assistance: Maximum assistance     Functional Limitations Info  Sight, Hearing, Speech Sight Info: Adequate Hearing Info: Adequate Speech Info: Adequate    SPECIAL CARE FACTORS FREQUENCY                       Contractures Contractures Info: Not present    Additional Factors Info  Code Status, Allergies, Isolation Precautions Code Status Info: Full Code Allergies Info:  Ciprofloxacin, Macrodantin Nitrofurantoin     Isolation Precautions Info: MRSA on Contact 08/07/17     Current Medications (08/07/2017):  This is the current hospital active medication list Current Facility-Administered Medications  Medication Dose Route Frequency Provider Last Rate Last Dose  . acetaminophen (TYLENOL) tablet 500 mg  500 mg Oral Q6H PRN Altamese DillingVachhani, Vaibhavkumar, MD   500 mg at 08/07/17 1322  . alum & mag hydroxide-simeth (MAALOX/MYLANTA) 200-200-20 MG/5ML suspension 30 mL  30 mL Oral Q6H PRN Altamese DillingVachhani, Vaibhavkumar, MD      . arformoterol (BROVANA) nebulizer solution 15 mcg  15 mcg Nebulization BID Altamese DillingVachhani, Vaibhavkumar, MD   15 mcg at 08/07/17 [id number]  . atorvastatin (LIPITOR) tablet 40 mg  40 mg Oral Daily Altamese DillingVachhani, Vaibhavkumar, MD   40 mg at 08/07/17 1009  . baclofen (LIORESAL) tablet 10 mg  10 mg Oral QID Altamese DillingVachhani, Vaibhavkumar, MD   10 mg at 08/07/17 1429  . bisacodyl (DULCOLAX) suppository 10 mg  10 mg Rectal PRN Altamese DillingVachhani, Vaibhavkumar, MD      . buPROPion (WELLBUTRIN XL) 24 hr tablet 300 mg  300 mg Oral Daily Altamese DillingVachhani, Vaibhavkumar, MD   300 mg at 08/07/17 1011  . cefTRIAXone (ROCEPHIN) 1 g in dextrose 5 % 50 mL IVPB  1 g Intravenous Q24H Altamese DillingVachhani, Vaibhavkumar, MD   Stopped at 08/07/17 1138  . Chlorhexidine Gluconate Cloth 2 % PADS 6 each  6 each Topical Q0600 Katha HammingKonidena, Snehalatha, MD  6 each at 08/07/17 1030  . cycloSPORINE (RESTASIS) 0.05 % ophthalmic emulsion 1 drop  1 drop Both Eyes BID Altamese Dilling, MD   1 drop at 08/07/17 1013  . docusate sodium (COLACE) capsule 100 mg  100 mg Oral BID PRN Altamese Dilling, MD      . folic acid (FOLVITE) tablet 1 mg  1 mg Oral Daily Altamese Dilling, MD   1 mg at 08/07/17 1010  . guaiFENesin (MUCINEX) 12 hr tablet 600 mg  600 mg Oral BID Altamese Dilling, MD   600 mg at 08/07/17 1012  . heparin injection 5,000 Units  5,000 Units Subcutaneous Q8H Altamese Dilling, MD   5,000 Units at 08/07/17 1428  .  ipratropium-albuterol (DUONEB) 0.5-2.5 (3) MG/3ML nebulizer solution 3 mL  3 mL Nebulization Q6H PRN Altamese Dilling, MD      . levothyroxine (SYNTHROID, LEVOTHROID) tablet 50 mcg  50 mcg Oral QAC breakfast Altamese Dilling, MD   50 mcg at 08/07/17 0809  . loratadine (CLARITIN) tablet 10 mg  10 mg Oral Daily Altamese Dilling, MD   10 mg at 08/07/17 1010  . magnesium oxide (MAG-OX) tablet 400 mg  400 mg Oral Daily Altamese Dilling, MD   400 mg at 08/07/17 1012  . Melatonin TABS 2.5-5 mg  2.5-5 mg Oral QHS Altamese Dilling, MD   5 mg at 08/06/17 2225  . montelukast (SINGULAIR) tablet 10 mg  10 mg Oral QHS Altamese Dilling, MD   10 mg at 08/06/17 2225  . multivitamin with minerals tablet 1 tablet  1 tablet Oral Daily Katha Hamming, MD   1 tablet at 08/07/17 1218  . mupirocin ointment (BACTROBAN) 2 % 1 application  1 application Nasal BID Katha Hamming, MD   1 application at 08/07/17 1038  . nutrition supplement (JUVEN) (JUVEN) powder packet 2 packet  2 packet Oral BID BM Katha Hamming, MD   2 packet at 08/07/17 1218  . olopatadine (PATANOL) 0.1 % ophthalmic solution 1 drop  1 drop Both Eyes BID Altamese Dilling, MD   1 drop at 08/07/17 1013  . oxybutynin (DITROPAN-XL) 24 hr tablet 15 mg  15 mg Oral QHS Altamese Dilling, MD   15 mg at 08/06/17 2226  . pantoprazole (PROTONIX) EC tablet 40 mg  40 mg Oral Daily Altamese Dilling, MD   40 mg at 08/07/17 1010  . polyethylene glycol (MIRALAX / GLYCOLAX) packet 17 g  1 packet Oral Daily Altamese Dilling, MD   17 g at 08/07/17 1013  . polyvinyl alcohol (LIQUIFILM TEARS) 1.4 % ophthalmic solution 1 drop  1 drop Both Eyes TID Altamese Dilling, MD   1 drop at 08/07/17 1429  . senna-docusate (Senokot-S) tablet 2 tablet  2 tablet Oral BID Altamese Dilling, MD   2 tablet at 08/07/17 1011  . tiotropium (SPIRIVA) inhalation capsule 18 mcg  18 mcg Inhalation Daily Altamese Dilling, MD   18 mcg at 08/07/17 1610     Discharge Medications: Please see discharge summary for a list of discharge medications.  Relevant Imaging Results:  Relevant Lab Results:   Additional Information SSN:  [id number]  Raye Sorrow, [place]

## 2017-08-08 DIAGNOSIS — N390 Urinary tract infection, site not specified: Secondary | ICD-10-CM | POA: Diagnosis not present

## 2017-08-08 LAB — URINE CULTURE: Culture: >=100000 — AB

## 2017-08-08 LAB — HIV ANTIBODY (ROUTINE TESTING W REFLEX): HIV Screen 4th Generation wRfx: NONREACTIVE

## 2017-08-08 MED ORDER — MUPIROCIN 2 % EX OINT: 1.0000 "application " | TOPICAL_OINTMENT | Freq: Two times a day (BID) | CUTANEOUS | 0 refills | Status: AC

## 2017-08-08 MED ORDER — JUVEN PO PACK: 2.0000 | PACK | Freq: Two times a day (BID) | ORAL | 0 refills | Status: AC

## 2017-08-08 MED ORDER — ONDANSETRON HCL 4 MG/2ML IJ SOLN
4.0000 mg | Freq: Four times a day (QID) | INTRAMUSCULAR | Status: DC | PRN
  Administered 2017-08-08: 4 mg via INTRAVENOUS
  Filled 2017-08-08: qty 2

## 2017-08-08 MED ORDER — CEFDINIR 300 MG PO CAPS: 300.0000 mg | ORAL_CAPSULE | Freq: Two times a day (BID) | ORAL | 0 refills | Status: DC

## 2017-08-08 MED ORDER — CHLORHEXIDINE GLUCONATE CLOTH 2 % EX PADS: 6.0000 | MEDICATED_PAD | Freq: Every day | CUTANEOUS | 0 refills | Status: AC

## 2017-08-08 MED ORDER — ONDANSETRON 4 MG PO TBDP: 4.0000 mg | ORAL_TABLET | Freq: Three times a day (TID) | ORAL | 0 refills | Status: AC | PRN

## 2017-08-08 NOTE — Progress Notes (Signed)
[facility] for [facility].
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
## 2017-08-08 NOTE — Progress Notes (Signed)
[facility]

## 2017-08-11 LAB — CULTURE, BLOOD (ROUTINE X 2)
CULTURE: NO GROWTH
Culture: NO GROWTH
Special Requests: ADEQUATE
Special Requests: ADEQUATE

## 2017-11-26 ENCOUNTER — Other Ambulatory Visit: Payer: Self-pay | Admitting: Family Medicine

## 2017-11-27 ENCOUNTER — Emergency Department: Payer: Medicare Other

## 2017-11-27 ENCOUNTER — Emergency Department
Admission: EM | Admit: 2017-11-27 | Discharge: 2017-11-27 | Disposition: A | Discharge status: Skilled Nursing Facility | Payer: Medicare Other | Attending: Emergency Medicine | Admitting: Emergency Medicine

## 2017-11-27 ENCOUNTER — Encounter: Payer: Self-pay | Admitting: Emergency Medicine

## 2017-11-27 DIAGNOSIS — R1084 Generalized abdominal pain: Secondary | ICD-10-CM | POA: Insufficient documentation

## 2017-11-27 DIAGNOSIS — E039 Hypothyroidism, unspecified: Secondary | ICD-10-CM | POA: Insufficient documentation

## 2017-11-27 DIAGNOSIS — I509 Heart failure, unspecified: Secondary | ICD-10-CM | POA: Diagnosis not present

## 2017-11-27 DIAGNOSIS — Z79899 Other long term (current) drug therapy: Secondary | ICD-10-CM | POA: Insufficient documentation

## 2017-11-27 DIAGNOSIS — J449 Chronic obstructive pulmonary disease, unspecified: Secondary | ICD-10-CM | POA: Diagnosis not present

## 2017-11-27 DIAGNOSIS — E119 Type 2 diabetes mellitus without complications: Secondary | ICD-10-CM | POA: Insufficient documentation

## 2017-11-27 DIAGNOSIS — Z87891 Personal history of nicotine dependence: Secondary | ICD-10-CM | POA: Insufficient documentation

## 2017-11-27 DIAGNOSIS — R51 Headache: Secondary | ICD-10-CM | POA: Diagnosis present

## 2017-11-27 DIAGNOSIS — R109 Unspecified abdominal pain: Secondary | ICD-10-CM

## 2017-11-27 DIAGNOSIS — N390 Urinary tract infection, site not specified: Secondary | ICD-10-CM | POA: Insufficient documentation

## 2017-11-27 LAB — CBC
HEMATOCRIT: 37.2 % (ref 35.0–47.0)
Hemoglobin: 12.7 g/dL (ref 12.0–16.0)
MCH: 30.7 pg (ref 26.0–34.0)
MCHC: 34 g/dL (ref 32.0–36.0)
MCV: 90.2 fL (ref 80.0–100.0)
PLATELETS: 259 10*3/uL (ref 150–440)
RBC: 4.13 MIL/uL (ref 3.80–5.20)
RDW: 14.1 % (ref 11.5–14.5)
WBC: 4.6 10*3/uL (ref 3.6–11.0)

## 2017-11-27 LAB — URINALYSIS, COMPLETE (UACMP) WITH MICROSCOPIC
Bilirubin Urine: NEGATIVE
Glucose, UA: NEGATIVE mg/dL
Ketones, ur: NEGATIVE mg/dL
Nitrite: POSITIVE — AB
PROTEIN: NEGATIVE mg/dL
SPECIFIC GRAVITY, URINE: 1.008 (ref 1.005–1.030)
SQUAMOUS EPITHELIAL / LPF: NONE SEEN (ref 0–5)
pH: 8 (ref 5.0–8.0)

## 2017-11-27 LAB — COMPREHENSIVE METABOLIC PANEL
ALBUMIN: 3.8 g/dL (ref 3.5–5.0)
ALT: 22 U/L (ref 14–54)
AST: 21 U/L (ref 15–41)
Alkaline Phosphatase: 153 U/L — ABNORMAL HIGH (ref 38–126)
Anion gap: 8 (ref 5–15)
BILIRUBIN TOTAL: 0.7 mg/dL (ref 0.3–1.2)
BUN: 13 mg/dL (ref 6–20)
CHLORIDE: 106 mmol/L (ref 101–111)
CO2: 23 mmol/L (ref 22–32)
CREATININE: 0.55 mg/dL (ref 0.44–1.00)
Calcium: 9.2 mg/dL (ref 8.9–10.3)
GFR calc Af Amer: >60 mL/min (ref >60)
GLUCOSE: 122 mg/dL — AB (ref 65–99)
POTASSIUM: 4 mmol/L (ref 3.5–5.1)
Sodium: 137 mmol/L (ref 135–145)
TOTAL PROTEIN: 7.4 g/dL (ref 6.5–8.1)

## 2017-11-27 LAB — LIPASE, BLOOD: Lipase: 24 U/L (ref 11–51)

## 2017-11-27 LAB — TROPONIN I: Troponin I: <0.03 ng/mL (ref <0.03)

## 2017-11-27 MED ORDER — CEPHALEXIN 500 MG PO CAPS: 500.0000 mg | ORAL_CAPSULE | Freq: Three times a day (TID) | ORAL | 0 refills | Status: AC

## 2017-11-27 MED ORDER — ACETAMINOPHEN 500 MG PO TABS
1000.0000 mg | ORAL_TABLET | Freq: Once | ORAL | Status: AC
  Administered 2017-11-27: 1000 mg via ORAL
  Filled 2017-11-27: qty 2

## 2017-11-27 MED ORDER — CEPHALEXIN 500 MG PO CAPS
500.0000 mg | ORAL_CAPSULE | Freq: Once | ORAL | Status: AC
  Administered 2017-11-27: 500 mg via ORAL
  Filled 2017-11-27: qty 1

## 2017-11-27 NOTE — ED Provider Notes (Signed)
[facility] Emergency Department Provider Note  Time seen: 11:14 AM  I have reviewed the triage vital signs and the nursing notes.   HISTORY  Chief Complaint Abdominal Pain and Headache    HPI Alexis [name] is a 62 y.o. female with a past medical history of anemia, anxiety, CHF, COPD, diabetes, gastric reflux, quadriplegia, nonambulatory at baseline, presents to the emergency department for abdominal pain and headache.  According to the patient for the past 1 to 2 months she has been experiencing abdominal discomfort, intermittent constipation and headache.  Patient states she is currently in a nursing home but does not believe that they are taking her seriously, she said they refused to give her Tylenol today for her headache so she came to the emergency department for evaluation.  Here the patient states she wants to talk to someone about being treated better at the nursing home.  She states she did not want to press charges or talk to police, she says they are not treating her right sometimes they bend her eyeglasses with a trying to put him back on her head, states they are not turning her frequently enough per patient.  States they take too long to get into the room when she calls them.  Patient denies any criminal neglect, states she just wants to be treated better.  States she is currently in the process of leaving this nursing home to go to a different nursing home.  Patient states her headache currently is mild and global.  States it is intermittent comes and goes but has been ongoing for approximately 1 to 2 months.  Also states abdominal pain intermittent, somewhat worse when she eats food.  Also states intermittent constipation, takes laxatives at times for the constipation.  Denies any vomiting.  Does state some nausea.  Denies any fever.  Patient has a chronic indwelling Foley catheter.   Past Medical History:  Diagnosis Date  . Anemia   . Anxiety   . Asthma    . CHF (congestive heart failure) ([place])   . COPD (chronic obstructive pulmonary disease) ([place])   . Diabetes mellitus without complication ([place])   . Dysphagia   . Gastroparesis   . GERD (gastroesophageal reflux disease)   . Hyperlipidemia   . Major depressive disorder   . Quadriplegia ([place])   . Thyroid disease    hypothyroid  . Urinary tract infection     Patient Active Problem List   Diagnosis Date Noted  . Pressure injury of skin 08/07/2017  . Acute lower UTI 08/06/2017  . UTI (urinary tract infection) 08/06/2017    Past Surgical History:  Procedure Laterality Date  . ureteral ostom      Prior to Admission medications   Medication Sig Start Date End Date Taking? Authorizing Provider  acetaminophen (TYLENOL) 500 MG tablet Take 1,000 mg by mouth every 6 (six) hours as needed for mild pain.    Yes [provider]  alum & mag hydroxide-simeth (MYLANTA) 200-200-20 MG/5ML suspension Take 30 mLs by mouth every 6 (six) hours as needed for indigestion or heartburn.   Yes [provider]  antiseptic oral rinse (BIOTENE) LIQD 15 mLs by Mouth Rinse route as needed for dry mouth. Do not swallow.   Yes [provider]  atorvastatin (LIPITOR) 20 MG tablet Take 20 mg by mouth daily.    Yes [provider]  baclofen (LIORESAL) 10 MG tablet Take 10 mg by mouth 4 (four) times daily.  Yes [provider]  bisacodyl (DULCOLAX) 10 MG suppository Place 20 mg rectally every other day. At bedtime.   Yes [provider]  bisacodyl (FLEET) 10 MG/30ML ENEM Place 10 mg rectally every Friday.    Yes [provider]  buPROPion (WELLBUTRIN SR) 100 MG 12 hr tablet Take 100 mg by mouth daily. Give with Wellbutrin XL 150 mg.   Yes [provider]  buPROPion (WELLBUTRIN XL) 150 MG 24 hr tablet Take 150 mg by mouth daily. Give with Wellbutrin SR 100 mg.   Yes [provider]  Cholecalciferol 50000 units TABS Take 1 tablet by mouth  every 30 (thirty) days.   Yes [provider]  Cyanocobalamin (B-12) 1000 MCG/ML KIT Inject 1 mL as directed every 30 (thirty) days.   Yes [provider]  cycloSPORINE (RESTASIS) 0.05 % ophthalmic emulsion Place 1 drop into both eyes 2 (two) times daily.   Yes [provider]  fexofenadine (ALLEGRA) 60 MG tablet Take 60 mg by mouth daily.   Yes [provider]  Fluocinolone Acetonide Scalp 0.01 % OIL Apply 1 application topically daily. Apply to scalp/ears every evening shift for seborrheic dermatitis. 11/05/17 01/04/18 Yes [provider]  folic acid (FOLVITE) 1 MG tablet Take 1 mg by mouth daily.   Yes [provider]  formoterol (PERFOROMIST) 20 MCG/2ML nebulizer solution Take 20 mcg by nebulization every 12 (twelve) hours.   Yes [provider]  guaiFENesin (MUCINEX) 600 MG 12 hr tablet Take 600 mg by mouth every 12 (twelve) hours as needed for cough (congestion).    Yes [provider]  hydrocortisone 2.5 % cream Apply 1 application topically daily. Apply to face during day shift for seborrheic dermatitis. 11/05/17 01/04/18 Yes [provider]  ibuprofen (ADVIL,MOTRIN) 400 MG tablet Take 400 mg by mouth every 8 (eight) hours as needed (ear pain).   Yes [provider]  ipratropium-albuterol (DUONEB) 0.5-2.5 (3) MG/3ML SOLN Take 3 mLs by nebulization every 6 (six) hours as needed.   Yes [provider]  ketoconazole (NIZORAL) 2 % cream Apply 1 application topically daily. Apply to scalp during day shift for seborrheic dermatitis. 11/05/17 01/04/18 Yes [provider]  ketoconazole (NIZORAL) 2 % shampoo Apply 1 application topically every Monday, Wednesday, and Friday.  11/06/17 01/05/18 Yes [provider]  levothyroxine (SYNTHROID, LEVOTHROID) 50 MCG tablet Take 50 mcg by mouth daily before breakfast.   Yes [provider]  linaclotide (LINZESS) 290 MCG CAPS capsule Take 290 mcg  by mouth daily before breakfast.   Yes [provider]  lisinopril (PRINIVIL,ZESTRIL) 5 MG tablet Take 5 mg by mouth daily.    Yes [provider]  magnesium oxide (MAG-OX) 400 MG tablet Take 400 mg by mouth daily.   Yes [provider]  Melatonin 3 MG TABS Take 1 tablet by mouth at bedtime.   Yes [provider]  Menthol (CEPACOL SORE THROAT MT) Use as directed 1 lozenge in the mouth or throat every 4 (four) hours as needed (sore throat).    Yes [provider]  montelukast (SINGULAIR) 10 MG tablet Take 10 mg by mouth at bedtime.   Yes [provider]  nutrition supplement, JUVEN, (JUVEN) PACK Take 2 packets by mouth 2 (two) times daily between meals. 08/08/17  Yes Salary, Montell D, MD  olive oil external oil Apply 1 drop topically at bedtime. Instill 1 drop in both ears at bedtime.   Yes [provider]  Olopatadine  HCl 0.2 % SOLN Place 1 drop into both eyes daily as needed (itching or redness.).    Yes [provider]  omeprazole (PRILOSEC) 20 MG capsule Take 20 mg by mouth 2 (two) times daily before a meal.   Yes [provider]  ondansetron (ZOFRAN) 4 MG tablet Take 4 mg by mouth every 8 (eight) hours as needed for nausea.    Yes [provider]  oxybutynin (DITROPAN XL) 15 MG 24 hr tablet Take 15 mg by mouth at bedtime.   Yes [provider]  polyethylene glycol (MIRALAX / GLYCOLAX) packet Take 17 g by mouth at bedtime.    Yes [provider]  propranolol (INDERAL) 10 MG tablet Take 20 mg by mouth 2 (two) times daily.   Yes [provider]  sennosides-docusate sodium (SENOKOT-S) 8.6-50 MG tablet Take 2 tablets by mouth 2 (two) times daily.   Yes [provider]  sodium chloride (OCEAN) 0.65 % SOLN nasal spray Place 2 sprays into both nostrils 3 (three) times daily.   Yes [provider]  sodium chloride 1 g tablet Take 2 g by mouth 3 (three) times daily.   Yes  [provider]  tiotropium (SPIRIVA) 18 MCG inhalation capsule Place 18 mcg into inhaler and inhale daily.   Yes [provider]  traZODone (DESYREL) 50 MG tablet Take 50 mg by mouth at bedtime.   Yes [provider]  triamcinolone (KENALOG) 0.025 % cream Apply 1 application topically daily. Apply to neck every evening for seborrheic dermatitis. Mix 1:1 with cerave cream.   Yes [provider]  Chlorhexidine Gluconate Cloth 2 % PADS Apply 6 each topically daily at 6 (six) AM. Patient not taking: Reported on 11/27/2017 08/09/17   Salary, Avel Peace, MD  mupirocin ointment (BACTROBAN) 2 % Place 1 application into the nose 2 (two) times daily. Patient not taking: Reported on 11/27/2017 08/08/17   Salary, Holly Bodily D, MD  ondansetron (ZOFRAN ODT) 4 MG disintegrating tablet Take 1 tablet (4 mg total) by mouth every 8 (eight) hours as needed for nausea or vomiting. Patient not taking: Reported on 11/27/2017 08/08/17   Salary, Avel Peace, MD    Allergies  Allergen Reactions  . Ciprofloxacin Rash  . Macrodantin [Nitrofurantoin] Rash    Family History  Problem Relation Age of Onset  . Diabetes Mother     Social History Social History   Tobacco Use  . Smoking status: Former Smoker    Types: Cigarettes  . Smokeless tobacco: Never Used  Substance Use Topics  . Alcohol use: No    Frequency: Never  . Drug use: No    Review of Systems Constitutional: Negative for fever. Eyes: Negative for visual complaints ENT: Negative for recent illness/congestion Cardiovascular: Negative for chest pain. Respiratory: Negative for shortness of breath. Gastrointestinal: Mild abdominal pain diffuse.  Positive for nausea.  Negative for vomiting or diarrhea.  Intermittent constipation. Genitourinary: Negative for urinary compaints, chronic indwelling Foley catheter. Musculoskeletal: Negative for musculoskeletal complaints Skin: Negative for skin complaints  Neurological:  Negative for headache All other ROS negative  ____________________________________________   PHYSICAL EXAM:  VITAL SIGNS: ED Triage Vitals  Enc Vitals Group     BP --      Pulse Rate 11/27/17 0917 62     Resp 11/27/17 0917 18     Temp 11/27/17 0917 97.8 F (36.6 C)     Temp Source 11/27/17 0917 Oral     SpO2 11/27/17 0917 98 %  Weight 11/27/17 0919 144 lb (65.3 kg)     Height 11/27/17 0919 _0  (1.626 m)     Head Circumference --      Peak Flow --      Pain Score 11/27/17 0918 9     Pain Loc --      Pain Edu? --      Excl. in [place]? --    Constitutional: Alert and oriented. Well appearing and in no distress. Eyes: Normal exam ENT   Head: Normocephalic and atraumatic.   Mouth/Throat: Mucous membranes are moist. Cardiovascular: Normal rate, regular rhythm. Respiratory: Normal respiratory effort without tachypnea nor retractions. Breath sounds are clear  Gastrointestinal: Obese, soft, mild right upper quadrant tenderness, patient states mild tenderness throughout the exam in all quadrants.  No rebound or guarding.  No distention.  No significant tenderness or reaction to abdominal palpation elicited. Musculoskeletal: Nontender with normal range of motion in all extremities. Neurologic:  Normal speech and language. No gross focal neurologic deficits  Skin:  Skin is warm, dry and intact.  Psychiatric: Mood and affect are normal.  ____________________________________________  EKG reviewed and interpreted by myself shows normal sinus rhythm at 55 bpm with a narrow QRS, normal axis, normal intervals patient does have fairly diffuse T wave inversions   INITIAL IMPRESSION / ASSESSMENT AND PLAN / ED COURSE  Pertinent labs & imaging results that were available during my care of the patient were reviewed by me and considered in my medical decision making (see chart for details).  Patient presents to the emergency department for abdominal discomfort and headache ongoing  intermittently over 1 to 2 months, denies any acute worsening today.  States she requested Tylenol from the nurse at the nursing home and they refused so she told him she needs to go to the emergency department.  Patient is also complaining of not being treated fairly, denies any criminal neglect, states there does not getting into her room quick enough when she calls them and occasionally they will bend her eyeglasses when trying to get them on her head.  Patient did not want to speak to police, did not want to press charges, I recommended that she talk to Adult Protective Services if she thinks there is any type of neglect, she is agreeable to this plan of care.  Patient's blood work is largely within normal limits including LFTs and lipase.  Normal white blood cell count.  Negative troponin.  We will obtain an abdominal x-ray to evaluate stool/colonic burden, will obtain a right upper quadrant ultrasound to evaluate liver and gallbladder.  Urinalysis is pending although chronic indwelling Foley catheter, likely colonized.  I reviewed the patient's records including GI records, patient has noted chronic constipation issues currently being treated by GI.  Patient is EKG does show fairly diffuse T wave inversions which is new since last EKG of 2012.  However the patient has no chest complaints, no shortness of breath, no chest pain.  Troponin is negative.  I believe the patient's EKG abnormality could be evaluated on an outpatient basis.  His urinalysis is positive for urinary tract infection versus chronic colonization.  We will discharge with Keflex.  Social worker has talked to the family and patient.  Patient will be discharged back to [facility] care, they will be working with the social worker at the facility, family meeting is been requested so they can discuss their concerns as well as possibility of moving to a different nursing facility.  Family is comfortable  with this plan of  care. ____________________________________________   FINAL CLINICAL IMPRESSION(S) / ED DIAGNOSES  Abdominal pain Headache UTI    Harvest Dark, MD 11/27/17 1327

## 2017-11-27 NOTE — ED Notes (Signed)
Patient transported to Ultrasound 

## 2017-11-27 NOTE — ED Notes (Signed)
Report given to Nurse Talbert Forest at [facility]. Informed that patient is waiting on transport via EMS for discharge.

## 2017-11-27 NOTE — Clinical Social Work Note (Signed)
Clinical Social Work Assessment  Patient Details  Name: Alexis [name] MRN: [id number] Date of Birth: 08/03/1955  Date of referral:  11/27/17               Reason for consult:  Family Concerns                Permission sought to share information with:  Facility Medical sales representative, Family Supports Permission granted to share information::  Yes, Verbal Permission Granted  Name::      Heron Sabins ( sister) Patsy Eisenhardt sister  Agency::   [facility]  Relationship::     Contact Information:     Housing/Transportation Living arrangements for the past 2 months:  Skilled Building surveyor of Information:  Patient, Adult Children, Siblings Patient Interpreter Needed:  None Criminal Activity/Legal Involvement Pertinent to Current Situation/Hospitalization:  No - Comment as needed Significant Relationships:  None Lives with:  Facility Resident Do you feel safe going back to the place where you live?    Need for family participation in patient care:  Yes (Comment)  Care giving concerns:  Family is requesting to set up meeting between [facility] and Family and pt to address residential concerns   Social Worker assessment / plan: LCSW  Introduced myself to family and obtained verbal consent to speak to patient in front of family members and facility. Patient agreed. Patient is not able to to care fore heself and required full assistance with all her ADL's. The purpose of this consult was to empower and educate family and advocate on families behalf to see that the patient is placed in another facility. LCSW explained and provided family with SNF resource list, ombudsman handout . Consulted with EDP to discuss plan of care .LCSW called SW Desire Peiress at [facility] and scheduled a family consult for May 16th at 2pm. No further needs  Employment status:  Retired Health and safety inspector:  Armed forces operational officer, Medicaid In [employer] care) PT Recommendations:    Information / Referral to community resources:   Skilled Holiday representative, Support Groups, Other (Comment Required)(Ombudsman for SNF)  Patient/Family's Response to care:  Concerned about her treatment at this facility  Patient/Family's Understanding of and Emotional Response to Diagnosis, Current Treatment, and Prognosis:  Good understanding  Emotional Assessment Appearance:  Appears stated age Attitude/Demeanor/Rapport:  Gracious Affect (typically observed):  Accepting, Calm Orientation:  Oriented to Self, Oriented to Place, Oriented to  Time, Oriented to Situation Alcohol / Substance use:  Not Applicable Psych involvement (Current and /or in the community):  No (Comment)  Discharge Needs  Concerns to be addressed:  Home Safety Concerns Readmission within the last 30 days:  No Current discharge risk:  None Barriers to Discharge:  No Barriers Identified   Cheron Schaumann, LCSW 11/27/2017, 1:36 PM

## 2017-11-27 NOTE — ED Notes (Signed)
Pt states that her whole body feels bad. Pt reports headache is over her whole head and abdominal pain is everywhere. Pt states that she also has something on her tongue and she is not sure what it is. Pt tongue dry with red raised areas. Pt states that it hurts also. Pt reports has been feeling this \\bad  for over a month.

## 2017-11-27 NOTE — Discharge Instructions (Addendum)
As we discussed your EKG was somewhat abnormal.  Your cardiac work-up/enzymes are normal.  Please follow-up with a cardiologist within the next 1 to 2 weeks for further evaluation and recheck.  Otherwise please follow-up with your primary care doctor next several days for recheck of your abdominal pain and your headache.

## 2017-11-27 NOTE — ED Notes (Signed)
Family requesting social work consult. MD Paduchowski at bedside speaking with family

## 2017-11-27 NOTE — Progress Notes (Signed)
Assessment completed, issues addressed to [facility]  SW and family and patient meeting scheduled at 2 pm May 16th,2019 and patient is to return.

## 2017-11-27 NOTE — ED Triage Notes (Signed)
[facility] reports that they gave pt a laxative last pm but the patient did not want them to touch her today so they sent her to the ED for eval. Pt has hx of chronic constipation.

## 2017-11-27 NOTE — ED Triage Notes (Signed)
Pt in via EMS from [facility] with c/o abdominal pain that makes it hard to breath and HA for little over a month.

## 2017-11-27 NOTE — ED Notes (Addendum)
Pt unable to sign. Family and pt verbalized discharge instructions and have no questions at this time. Pt is to be transported by [facility] to [facility]

## 2017-11-28 ENCOUNTER — Other Ambulatory Visit: Payer: Self-pay | Admitting: Family Medicine

## 2017-11-28 DIAGNOSIS — R1310 Dysphagia, unspecified: Secondary | ICD-10-CM

## 2017-11-28 LAB — URINE CULTURE

## 2017-12-04 ENCOUNTER — Ambulatory Visit
Admission: RE | Admit: 2017-12-04 | Discharge: 2017-12-04 | Disposition: A | Discharge status: Home / Self Care | Payer: Medicare Other | Source: Ambulatory Visit | Attending: Family Medicine | Admitting: Family Medicine

## 2017-12-04 DIAGNOSIS — R1310 Dysphagia, unspecified: Secondary | ICD-10-CM | POA: Diagnosis present

## 2017-12-04 DIAGNOSIS — K219 Gastro-esophageal reflux disease without esophagitis: Secondary | ICD-10-CM | POA: Diagnosis not present

## 2017-12-12 ENCOUNTER — Emergency Department
Admission: EM | Admit: 2017-12-12 | Discharge: 2017-12-12 | Disposition: A | Discharge status: Skilled Nursing Facility | Payer: Medicare Other | Attending: Emergency Medicine | Admitting: Emergency Medicine

## 2017-12-12 ENCOUNTER — Encounter: Payer: Self-pay | Admitting: Emergency Medicine

## 2017-12-12 DIAGNOSIS — Z593 Problems related to living in residential institution: Secondary | ICD-10-CM | POA: Diagnosis not present

## 2017-12-12 DIAGNOSIS — Z87891 Personal history of nicotine dependence: Secondary | ICD-10-CM | POA: Diagnosis not present

## 2017-12-12 DIAGNOSIS — Z046 Encounter for general psychiatric examination, requested by authority: Secondary | ICD-10-CM | POA: Diagnosis not present

## 2017-12-12 DIAGNOSIS — E119 Type 2 diabetes mellitus without complications: Secondary | ICD-10-CM | POA: Diagnosis not present

## 2017-12-12 DIAGNOSIS — E039 Hypothyroidism, unspecified: Secondary | ICD-10-CM | POA: Insufficient documentation

## 2017-12-12 DIAGNOSIS — G8929 Other chronic pain: Secondary | ICD-10-CM | POA: Diagnosis not present

## 2017-12-12 DIAGNOSIS — Z79899 Other long term (current) drug therapy: Secondary | ICD-10-CM | POA: Insufficient documentation

## 2017-12-12 DIAGNOSIS — J45909 Unspecified asthma, uncomplicated: Secondary | ICD-10-CM | POA: Diagnosis not present

## 2017-12-12 DIAGNOSIS — R109 Unspecified abdominal pain: Secondary | ICD-10-CM | POA: Insufficient documentation

## 2017-12-12 DIAGNOSIS — F4325 Adjustment disorder with mixed disturbance of emotions and conduct: Secondary | ICD-10-CM | POA: Diagnosis not present

## 2017-12-12 LAB — COMPREHENSIVE METABOLIC PANEL
ALK PHOS: 115 U/L (ref 38–126)
ALT: 16 U/L (ref 14–54)
ANION GAP: 9 (ref 5–15)
AST: 21 U/L (ref 15–41)
Albumin: 3.6 g/dL (ref 3.5–5.0)
BILIRUBIN TOTAL: 0.9 mg/dL (ref 0.3–1.2)
BUN: 17 mg/dL (ref 6–20)
CALCIUM: 8.9 mg/dL (ref 8.9–10.3)
CO2: 22 mmol/L (ref 22–32)
CREATININE: 0.58 mg/dL (ref 0.44–1.00)
Chloride: 111 mmol/L (ref 101–111)
Glucose, Bld: 92 mg/dL (ref 65–99)
Potassium: 3.5 mmol/L (ref 3.5–5.1)
SODIUM: 142 mmol/L (ref 135–145)
TOTAL PROTEIN: 6.9 g/dL (ref 6.5–8.1)

## 2017-12-12 LAB — CBC
HCT: 34.8 % — ABNORMAL LOW (ref 35.0–47.0)
Hemoglobin: 11.7 g/dL — ABNORMAL LOW (ref 12.0–16.0)
MCH: 30.3 pg (ref 26.0–34.0)
MCHC: 33.6 g/dL (ref 32.0–36.0)
MCV: 90.3 fL (ref 80.0–100.0)
PLATELETS: 262 10*3/uL (ref 150–440)
RBC: 3.85 MIL/uL (ref 3.80–5.20)
RDW: 14.3 % (ref 11.5–14.5)
WBC: 4.8 10*3/uL (ref 3.6–11.0)

## 2017-12-12 LAB — ETHANOL: Alcohol, Ethyl (B): <10 mg/dL (ref <10)

## 2017-12-12 MED ORDER — ACETAMINOPHEN 325 MG PO TABS
650.0000 mg | ORAL_TABLET | Freq: Once | ORAL | Status: DC
  Filled 2017-12-12: qty 2

## 2017-12-12 MED ORDER — ACETAMINOPHEN 325 MG PO TABS
650.0000 mg | ORAL_TABLET | Freq: Once | ORAL | Status: AC
  Administered 2017-12-12: 650 mg via ORAL

## 2017-12-12 MED ORDER — ACETAMINOPHEN 500 MG PO TABS: 1000.0000 mg | ORAL_TABLET | Freq: Once | ORAL | Status: DC

## 2017-12-12 NOTE — ED Notes (Signed)
Pt states she has not been treated well at [facility]. She states "I just want to get out of there" and "go to another place but they don't have another place." Pt left [facility] care in wheelchair today and the facility had her IVC'd. She states that "they turn me hard even though they know my stomach hurts." Pt states that they "do weird things" like "they give me food that don't taste good" and "they moved me to another hall and there ain't nowhere to put my stuff." Pt alert & oriented with NAD noted.

## 2017-12-12 NOTE — ED Notes (Signed)
Lunch tray and drink provided to pt 

## 2017-12-12 NOTE — ED Provider Notes (Signed)
[facility] Emergency Department Provider Note  ____________________________________________  Time seen: Approximately 2:40 PM  I have reviewed the triage vital signs and the nursing notes.   HISTORY  Chief Complaint Psychiatric Evaluation    HPI Alexis [name] is a 62 y.o. female with a history of diabetes COPD CHF GERD and quadriplegia who is brought to the ED under involuntary commitment today. The patient denies any acute complaints, but reports that she got fed up with her nursing home and wanted to leave there so she hopped on her mobility scooter and got on out of there. denies any acute complaints. Only has her chronic abdominal pain which is unchanged. No vomiting or diarrhea fevers or chills. No SI HI or hallucinations. Compliant with her medication. She confirms that her nursing home does give her her medications and provide for her medical needs. She also confirms that she is working with her case Freight forwarder to find alternate residential arrangement.     Past Medical History:  Diagnosis Date  . Anemia   . Anxiety   . Asthma   . CHF (congestive heart failure) ([place])   . COPD (chronic obstructive pulmonary disease) ([place])   . Diabetes mellitus without complication ([place])   . Dysphagia   . Gastroparesis   . GERD (gastroesophageal reflux disease)   . Hyperlipidemia   . Major depressive disorder   . Quadriplegia ([place])   . Thyroid disease    hypothyroid  . Urinary tract infection      Patient Active Problem List   Diagnosis Date Noted  . Pressure injury of skin 08/07/2017  . Acute lower UTI 08/06/2017  . UTI (urinary tract infection) 08/06/2017     Past Surgical History:  Procedure Laterality Date  . ureteral ostom       Prior to Admission medications   Medication Sig Start Date End Date Taking? Authorizing Provider  acetaminophen (TYLENOL) 500 MG tablet Take 1,000 mg by mouth every 6 (six) hours as needed for mild pain.     [provider]  alum & mag hydroxide-simeth (MYLANTA) 200-200-20 MG/5ML suspension Take 30 mLs by mouth every 6 (six) hours as needed for indigestion or heartburn.    [provider]  antiseptic oral rinse (BIOTENE) LIQD 15 mLs by Mouth Rinse route as needed for dry mouth. Do not swallow.    [provider]  atorvastatin (LIPITOR) 20 MG tablet Take 20 mg by mouth daily.     [provider]  baclofen (LIORESAL) 10 MG tablet Take 10 mg by mouth 4 (four) times daily.    [provider]  bisacodyl (DULCOLAX) 10 MG suppository Place 20 mg rectally every other day. At bedtime.    [provider]  bisacodyl (FLEET) 10 MG/30ML ENEM Place 10 mg rectally every Friday.     [provider]  buPROPion (WELLBUTRIN SR) 100 MG 12 hr tablet Take 100 mg by mouth daily. Give with Wellbutrin XL 150 mg.    [provider]  buPROPion (WELLBUTRIN XL) 150 MG 24 hr tablet Take 150 mg by mouth daily. Give with Wellbutrin SR 100 mg.    [provider]  cephALEXin (KEFLEX) 500 MG capsule Take 1 capsule (500 mg total) by mouth 3 (three) times daily. 11/27/17   Harvest Dark, MD  Chlorhexidine Gluconate Cloth 2 % PADS Apply 6 each topically daily at 6 (six) AM. Patient not taking: Reported on 11/27/2017 08/09/17   Salary, Avel Peace, MD  Cholecalciferol 50000 units  TABS Take 1 tablet by mouth every 30 (thirty) days.    [provider]  Cyanocobalamin (B-12) 1000 MCG/ML KIT Inject 1 mL as directed every 30 (thirty) days.    [provider]  cycloSPORINE (RESTASIS) 0.05 % ophthalmic emulsion Place 1 drop into both eyes 2 (two) times daily.    [provider]  fexofenadine (ALLEGRA) 60 MG tablet Take 60 mg by mouth daily.    [provider]  Fluocinolone Acetonide Scalp 0.01 % OIL Apply 1 application topically daily. Apply to scalp/ears every evening shift for seborrheic dermatitis. 11/05/17 01/04/18  [provider]   folic acid (FOLVITE) 1 MG tablet Take 1 mg by mouth daily.    [provider]  formoterol (PERFOROMIST) 20 MCG/2ML nebulizer solution Take 20 mcg by nebulization every 12 (twelve) hours.    [provider]  guaiFENesin (MUCINEX) 600 MG 12 hr tablet Take 600 mg by mouth every 12 (twelve) hours as needed for cough (congestion).     [provider]  hydrocortisone 2.5 % cream Apply 1 application topically daily. Apply to face during day shift for seborrheic dermatitis. 11/05/17 01/04/18  [provider]  ibuprofen (ADVIL,MOTRIN) 400 MG tablet Take 400 mg by mouth every 8 (eight) hours as needed (ear pain).    [provider]  ipratropium-albuterol (DUONEB) 0.5-2.5 (3) MG/3ML SOLN Take 3 mLs by nebulization every 6 (six) hours as needed.    [provider]  ketoconazole (NIZORAL) 2 % cream Apply 1 application topically daily. Apply to scalp during day shift for seborrheic dermatitis. 11/05/17 01/04/18  [provider]  ketoconazole (NIZORAL) 2 % shampoo Apply 1 application topically every Monday, Wednesday, and Friday.  11/06/17 01/05/18  [provider]  levothyroxine (SYNTHROID, LEVOTHROID) 50 MCG tablet Take 50 mcg by mouth daily before breakfast.    [provider]  linaclotide (LINZESS) 290 MCG CAPS capsule Take 290 mcg by mouth daily before breakfast.    [provider]  lisinopril (PRINIVIL,ZESTRIL) 5 MG tablet Take 5 mg by mouth daily.     [provider]  magnesium oxide (MAG-OX) 400 MG tablet Take 400 mg by mouth daily.    [provider]  Melatonin 3 MG TABS Take 1 tablet by mouth at bedtime.    [provider]  Menthol (CEPACOL SORE THROAT MT) Use as directed 1 lozenge in the mouth or throat every 4 (four) hours as needed (sore throat).     [provider]  montelukast (SINGULAIR) 10 MG tablet Take 10 mg by mouth at bedtime.    [provider]  mupirocin ointment  (BACTROBAN) 2 % Place 1 application into the nose 2 (two) times daily. Patient not taking: Reported on 11/27/2017 08/08/17   Salary, Avel Peace, MD  nutrition supplement, JUVEN, (JUVEN) PACK Take 2 packets by mouth 2 (two) times daily between meals. 08/08/17   Salary, Holly Bodily D, MD  olive oil external oil Apply 1 drop topically at bedtime. Instill 1 drop in both ears at bedtime.    [provider]  Olopatadine HCl 0.2 % SOLN Place 1 drop into both eyes daily as needed (itching or redness.).     [provider]  omeprazole (PRILOSEC) 20 MG capsule Take 20 mg by mouth 2 (two) times daily before a meal.    [provider]  ondansetron (ZOFRAN ODT) 4 MG disintegrating tablet Take 1 tablet (4 mg total) by mouth every 8 (eight) hours as needed for nausea or vomiting.  Patient not taking: Reported on 11/27/2017 08/08/17   Salary, Holly Bodily D, MD  ondansetron (ZOFRAN) 4 MG tablet Take 4 mg by mouth every 8 (eight) hours as needed for nausea.     [provider]  oxybutynin (DITROPAN XL) 15 MG 24 hr tablet Take 15 mg by mouth at bedtime.    [provider]  polyethylene glycol (MIRALAX / GLYCOLAX) packet Take 17 g by mouth at bedtime.     [provider]  propranolol (INDERAL) 10 MG tablet Take 20 mg by mouth 2 (two) times daily.    [provider]  sennosides-docusate sodium (SENOKOT-S) 8.6-50 MG tablet Take 2 tablets by mouth 2 (two) times daily.    [provider]  sodium chloride (OCEAN) 0.65 % SOLN nasal spray Place 2 sprays into both nostrils 3 (three) times daily.    [provider]  sodium chloride 1 g tablet Take 2 g by mouth 3 (three) times daily.    [provider]  tiotropium (SPIRIVA) 18 MCG inhalation capsule Place 18 mcg into inhaler and inhale daily.    [provider]  traZODone (DESYREL) 50 MG tablet Take 50 mg by mouth at bedtime.    [provider]  triamcinolone (KENALOG) 0.025 % cream  Apply 1 application topically daily. Apply to neck every evening for seborrheic dermatitis. Mix 1:1 with cerave cream.    [provider]     Allergies Ciprofloxacin and Macrodantin [nitrofurantoin]   Family History  Problem Relation Age of Onset  . Diabetes Mother     Social History Social History   Tobacco Use  . Smoking status: Former Smoker    Types: Cigarettes  . Smokeless tobacco: Never Used  Substance Use Topics  . Alcohol use: No    Frequency: Never  . Drug use: No    Review of Systems  Constitutional:   No fever or chills.  ENT:   No sore throat. No rhinorrhea. Cardiovascular:   No chest pain or syncope. Respiratory:   No dyspnea or cough. Gastrointestinal:   chronic abdominal pain without vomiting and diarrhea.   All other systems reviewed and are negative except as documented above in ROS and HPI.  ____________________________________________   PHYSICAL EXAM:  VITAL SIGNS: ED Triage Vitals  Enc Vitals Group     BP 12/12/17 1303 129/77     Pulse Rate 12/12/17 1303 (!) 59     Resp 12/12/17 1303 16     Temp 12/12/17 1303 98.9 F (37.2 C)     Temp Source 12/12/17 1303 Oral     SpO2 12/12/17 1303 99 %     Weight 12/12/17 1304 144 lb (65.3 kg)     Height 12/12/17 1304 _0  (1.626 m)     Head Circumference --      [facility] Flow --      Pain Score 12/12/17 1304 5     Pain Loc --      Pain Edu? --      Excl. in [place]? --     Vital signs reviewed, nursing assessments reviewed.   Constitutional:   Alert and oriented. Well appearing and in no distress. Eyes:   Conjunctivae are normal. EOMI. PERRL. ENT      Head:   Normocephalic and atraumatic.      Nose:   No congestion/rhinnorhea.       Mouth/Throat:   dry mucous membranes, no pharyngeal erythema. No peritonsillar mass.       Neck:  No meningismus. Full ROM. Hematological/Lymphatic/Immunilogical:   No cervical lymphadenopathy. Cardiovascular:   RRR. Symmetric bilateral radial and DP pulses.   No murmurs.  Respiratory:   Normal respiratory effort without tachypnea/retractions. Breath sounds are clear and equal bilaterally. No wheezes/rales/rhonchi. Gastrointestinal:   Soft and nontender. Non distended. There is no CVA tenderness.  No rebound, rigidity, or guarding. Musculoskeletal:   Normal range of motion in all extremities. No joint effusions.  No lower extremity tenderness.  No edema.emaciated lower extremities Neurologic:   Normal speech and language.  Motor grossly intact. No acute focal neurologic deficits are appreciated.  Skin:    Skin is warm, dry and intact. No rash noted.  No petechiae, purpura, or bullae.  ____________________________________________    LABS (pertinent positives/negatives) (all labs ordered are listed, but only abnormal results are displayed) Labs Reviewed  CBC - Abnormal; Notable for the following components:      Result Value   Hemoglobin 11.7 (*)    HCT 34.8 (*)    All other components within normal limits  COMPREHENSIVE METABOLIC PANEL  ETHANOL  URINE DRUG SCREEN, QUALITATIVE ([facility] ONLY)   ____________________________________________   EKG    ____________________________________________    RADIOLOGY  No results found.  ____________________________________________   PROCEDURES Procedures  ____________________________________________    CLINICAL IMPRESSION / ASSESSMENT AND PLAN / ED COURSE  Pertinent labs & imaging results that were available during my care of the patient were reviewed by me and considered in my medical decision making (see chart for details).    patient well appearing no acute distress normal vital signs. Unremarkable exam, no acute complaints. Brought here under involuntary commitment due to trying to run away from her nursing home on her mobility scooter. She is medically stable. Psychiatry evaluated her and Dr. Weber Cooks agrees that she has no acute psychiatric needs and is not a safety risk to herself  or others. We will discharge her home to continue following up with primary care and her case manager regarding alternate placement if necessary.      ____________________________________________   FINAL CLINICAL IMPRESSION(S) / ED DIAGNOSES    Final diagnoses:  Type 2 diabetes mellitus without complication, unspecified whether long term insulin use ([place])  Chronic abdominal pain     ED Discharge Orders    None      Portions of this note were generated with dragon dictation software. Dictation errors may occur despite best attempts at proofreading.    Carrie Mew, MD 12/12/17 [phone]

## 2017-12-12 NOTE — Discharge Instructions (Addendum)
Please continue following up with your primary care doctor and case manager to seek alternate nursing home placement if you are dissatisfied with your current residential arrangement.

## 2017-12-12 NOTE — ED Notes (Signed)
Pt adamant that she does not want to go back. Pt's brother states he has called [facility] and that they can take her if she spends a few days in the hospital. Pt informed that she does not have a medical reason to be hospitalized, but she states she cannot go back.

## 2017-12-12 NOTE — ED Notes (Signed)
Social worker in with family discussing pt disposition. Family and pt do not want pt to return to [facility].

## 2017-12-12 NOTE — ED Notes (Signed)
Called EMS for transport to [facility] as previous transport was cancelled by patient [phone]

## 2017-12-12 NOTE — ED Triage Notes (Signed)
Pt comes into the ED via [facility] from [facility] where the facility IVC's her.  Patient ran away from the facility on her motorized wheelchair and refused to go back.  Patient has nowhere she can go due to no family to be able to take care of her medically.  Patient Denies wanting to hurt herself or anyone else at this time.  Patient in NAD with even and unlabored respirations.  H/o contractions, dysphagia, and colostomy.

## 2017-12-12 NOTE — Clinical Social Work Note (Signed)
CSW was approached by [facility] and EDP Dr. Darnelle Catalan about patient refusing to return to long term care facility. Patient is a resident at [facility] ([facility]) and has been "for years." Patient and family not pleased with care being provided by facility. CSW provided validation of patient and family concerns. CSW offered assistance in referring patient to different facilities. CSW empowered family to continue advocating for patient by providing resources for the [employer] and a listing of SNF facilities. CSW provided CSW contact information to family.  CSW completed FL-2 and referred to SNFs requested: [facility], [facility], and [facility]. Patient agreeable to return to [facility]. CSW updated [facility] and EDP Dr. Darnelle Catalan. CSW requested ED Secretary Luanne call back EMS for patient transport. CSW signing off as no further Social Work need identified.   Corlis Hove, Theresia Majors, [facility] Clinical Social Worker-ED [phone]

## 2017-12-12 NOTE — NC FL2 (Signed)
[place] LEVEL OF CARE SCREENING TOOL     IDENTIFICATION  Patient Name: Alexis [name] Birthdate: 1955/10/28 Sex: female Admission Date (Current Location): 12/12/2017  [place] and [place] Number:  Hulan Fess [id number] [place] and Address:  [facility], [address] [place], [place] [address]      Provider Number: [id number]  Attending Physician Name and Address:  No att. providers found  Relative Name and Phone Number:  Adah Perl [phone] / Brother-Charles Malizia [phone]    Current Level of Care: Hospital Recommended Level of Care: [place] Prior Approval Number:    Date Approved/Denied:   PASRR Number: [id number]  Discharge Plan: SNF    Current Diagnoses: Patient Active Problem List   Diagnosis Date Noted  . 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 Pressure injury of skin 08/07/2017  . Acute lower UTI 08/06/2017  . UTI (urinary tract infection) 08/06/2017    Orientation RESPIRATION BLADDER Height & Weight     Self, Place, Time, Situation  Normal Urostomy Weight: 144 lb (65.3 kg) Height:  _0  (162.6 cm)  BEHAVIORAL SYMPTOMS/MOOD NEUROLOGICAL BOWEL NUTRITION STATUS      Incontinent Diet  AMBULATORY STATUS COMMUNICATION OF NEEDS Skin   Total Care(Quadriplegic; Charity fundraiser) Verbally Normal                       Personal Care Assistance Level of Assistance  Bathing, Feeding, Dressing Bathing Assistance: Maximum assistance Feeding assistance: Limited assistance Dressing Assistance: Maximum assistance     Functional Limitations Info  Sight, Hearing, Speech Sight Info: Adequate Hearing Info: Adequate Speech Info: Adequate    SPECIAL CARE FACTORS FREQUENCY                       Contractures Contractures Info: Not present    Additional Factors Info  Code Status, Allergies, Psychotropic Code Status Info:  Full Allergies Info: Ciprofloxacin, Macrodantin Nitrofurantoin Psychotropic Info: See Med List         Current Medications (12/12/2017):  This is the current hospital active medication list No current facility-administered medications for this encounter.    Current Outpatient Medications  Medication Sig Dispense Refill  . acetaminophen (TYLENOL) 500 MG tablet Take 1,000 mg by mouth every 6 (six) hours as needed for mild pain.     Marland Kitchen alum & mag hydroxide-simeth (MYLANTA) 124-580-99 MG/5ML suspension Take 30 mLs by mouth every 6 (six) hours as needed for indigestion or heartburn.    Marland Kitchen antiseptic oral rinse (BIOTENE) LIQD 15 mLs by Mouth Rinse route as needed for dry mouth. Do not swallow.    Marland Kitchen atorvastatin (LIPITOR) 20 MG tablet Take 20 mg by mouth daily.     . baclofen (LIORESAL) 10 MG tablet Take 10 mg by mouth 4 (four) times daily.    . bisacodyl (DULCOLAX) 10 MG suppository Place 20 mg rectally every other day. At bedtime.    . bisacodyl (FLEET) 10 MG/30ML ENEM Place 10 mg rectally every Friday.     Marland Kitchen buPROPion (WELLBUTRIN SR) 100 MG 12 hr tablet Take 100 mg by mouth daily. Give with Wellbutrin XL 150 mg.    . buPROPion (WELLBUTRIN XL) 150 MG 24 hr tablet Take 150 mg by mouth daily. Give with Wellbutrin SR 100 mg.    . cephALEXin (KEFLEX) 500 MG capsule Take 1 capsule (500 mg total) by mouth 3 (three) times daily. 30 capsule 0  .  Chlorhexidine Gluconate Cloth 2 % PADS Apply 6 each topically daily at 6 (six) AM. (Patient not taking: Reported on 11/27/2017) 5 each 0  . Cholecalciferol 50000 units TABS Take 1 tablet by mouth every 30 (thirty) days.    . Cyanocobalamin (B-12) 1000 MCG/ML KIT Inject 1 mL as directed every 30 (thirty) days.    . cycloSPORINE (RESTASIS) 0.05 % ophthalmic emulsion Place 1 drop into both eyes 2 (two) times daily.    . fexofenadine (ALLEGRA) 60 MG tablet Take 60 mg by mouth daily.    . Fluocinolone Acetonide Scalp 0.01 % OIL Apply 1 application topically daily. Apply  to scalp/ears every evening shift for seborrheic dermatitis.    . folic acid (FOLVITE) 1 MG tablet Take 1 mg by mouth daily.    . formoterol (PERFOROMIST) 20 MCG/2ML nebulizer solution Take 20 mcg by nebulization every 12 (twelve) hours.    Marland Kitchen guaiFENesin (MUCINEX) 600 MG 12 hr tablet Take 600 mg by mouth every 12 (twelve) hours as needed for cough (congestion).     . hydrocortisone 2.5 % cream Apply 1 application topically daily. Apply to face during day shift for seborrheic dermatitis.    Marland Kitchen ibuprofen (ADVIL,MOTRIN) 400 MG tablet Take 400 mg by mouth every 8 (eight) hours as needed (ear pain).    Marland Kitchen ipratropium-albuterol (DUONEB) 0.5-2.5 (3) MG/3ML SOLN Take 3 mLs by nebulization every 6 (six) hours as needed.    Marland Kitchen ketoconazole (NIZORAL) 2 % cream Apply 1 application topically daily. Apply to scalp during day shift for seborrheic dermatitis.    Marland Kitchen ketoconazole (NIZORAL) 2 % shampoo Apply 1 application topically every Monday, Wednesday, and Friday.     . levothyroxine (SYNTHROID, LEVOTHROID) 50 MCG tablet Take 50 mcg by mouth daily before breakfast.    . linaclotide (LINZESS) 290 MCG CAPS capsule Take 290 mcg by mouth daily before breakfast.    . lisinopril (PRINIVIL,ZESTRIL) 5 MG tablet Take 5 mg by mouth daily.     . magnesium oxide (MAG-OX) 400 MG tablet Take 400 mg by mouth daily.    . Melatonin 3 MG TABS Take 1 tablet by mouth at bedtime.    . Menthol (CEPACOL SORE THROAT MT) Use as directed 1 lozenge in the mouth or throat every 4 (four) hours as needed (sore throat).     . montelukast (SINGULAIR) 10 MG tablet Take 10 mg by mouth at bedtime.    . mupirocin ointment (BACTROBAN) 2 % Place 1 application into the nose 2 (two) times daily. (Patient not taking: Reported on 11/27/2017) 22 g 0  . nutrition supplement, JUVEN, (JUVEN) PACK Take 2 packets by mouth 2 (two) times daily between meals. 180 packet 0  . olive oil external oil Apply 1 drop topically at bedtime. Instill 1 drop in both ears at  bedtime.    . Olopatadine HCl 0.2 % SOLN Place 1 drop into both eyes daily as needed (itching or redness.).     Marland Kitchen omeprazole (PRILOSEC) 20 MG capsule Take 20 mg by mouth 2 (two) times daily before a meal.    . ondansetron (ZOFRAN ODT) 4 MG disintegrating tablet Take 1 tablet (4 mg total) by mouth every 8 (eight) hours as needed for nausea or vomiting. (Patient not taking: Reported on 11/27/2017) 20 tablet 0  . ondansetron (ZOFRAN) 4 MG tablet Take 4 mg by mouth every 8 (eight) hours as needed for nausea.     Marland Kitchen oxybutynin (DITROPAN XL) 15 MG 24 hr tablet Take 15 mg by  mouth at bedtime.    . polyethylene glycol (MIRALAX / GLYCOLAX) packet Take 17 g by mouth at bedtime.     . propranolol (INDERAL) 10 MG tablet Take 20 mg by mouth 2 (two) times daily.    . sennosides-docusate sodium (SENOKOT-S) 8.6-50 MG tablet Take 2 tablets by mouth 2 (two) times daily.    . sodium chloride (OCEAN) 0.65 % SOLN nasal spray Place 2 sprays into both nostrils 3 (three) times daily.    . sodium chloride 1 g tablet Take 2 g by mouth 3 (three) times daily.    Marland Kitchen tiotropium (SPIRIVA) 18 MCG inhalation capsule Place 18 mcg into inhaler and inhale daily.    . traZODone (DESYREL) 50 MG tablet Take 50 mg by mouth at bedtime.    . triamcinolone (KENALOG) 0.025 % cream Apply 1 application topically daily. Apply to neck every evening for seborrheic dermatitis. Mix 1:1 with cerave cream.       Discharge Medications: Please see discharge summary for a list of discharge medications.  Relevant Imaging Results:  Relevant Lab Results:   Additional Information SSN: [id number]  Truitt Merle, LCSW
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
## 2017-12-12 NOTE — ED Notes (Signed)
Pt transported back to [facility] by EMS

## 2018-04-15 DEATH — deceased

## 2019-02-14 IMAGING — US US ABDOMEN LIMITED
1 series · 14 of 25 positions shown · non-contrast
Comparison: Ultrasound March 25, 2017.

CLINICAL DATA: Right upper quadrant abdominal pain.

EXAM:
ULTRASOUND ABDOMEN LIMITED RIGHT UPPER QUADRANT

[Series 1: us abdomen limited · 0.15mm/px · 14 of 48 slices shown]
[im 1/48]
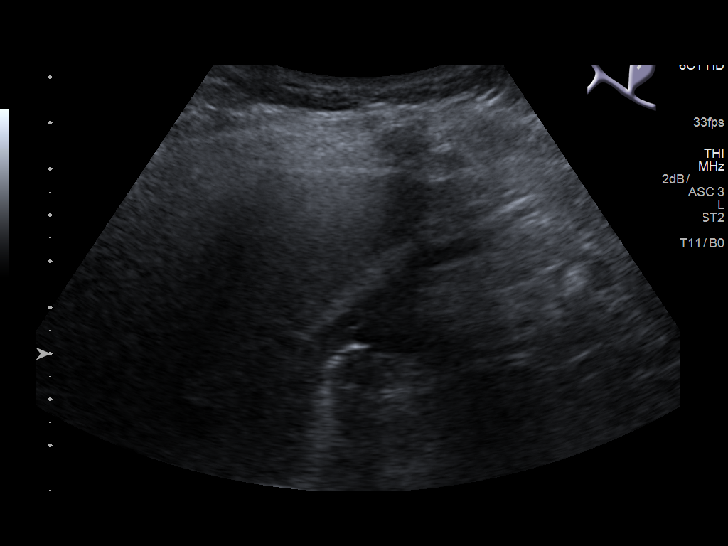
[im 4/48]
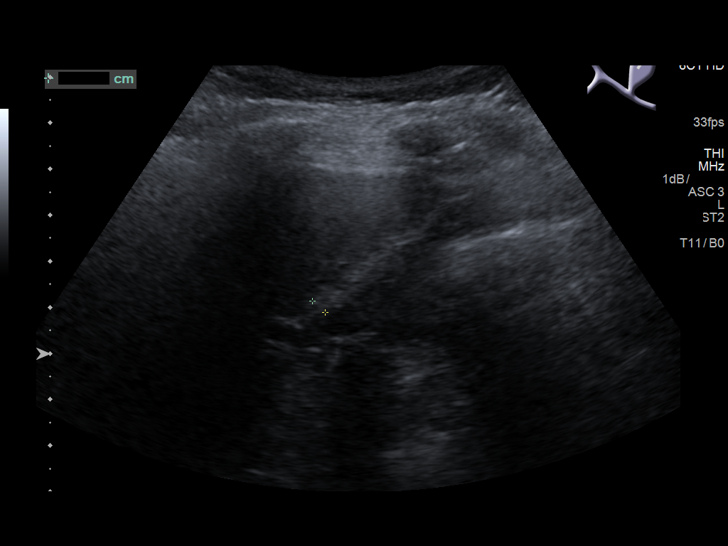
[im 8/48]
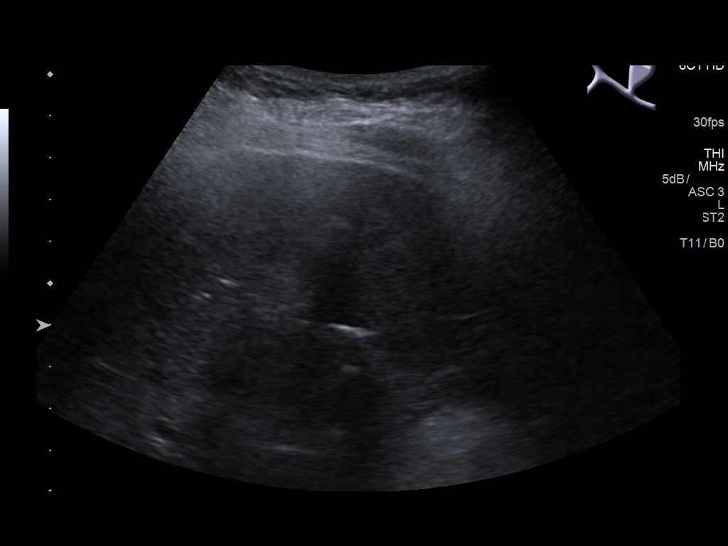
[im 12/48]
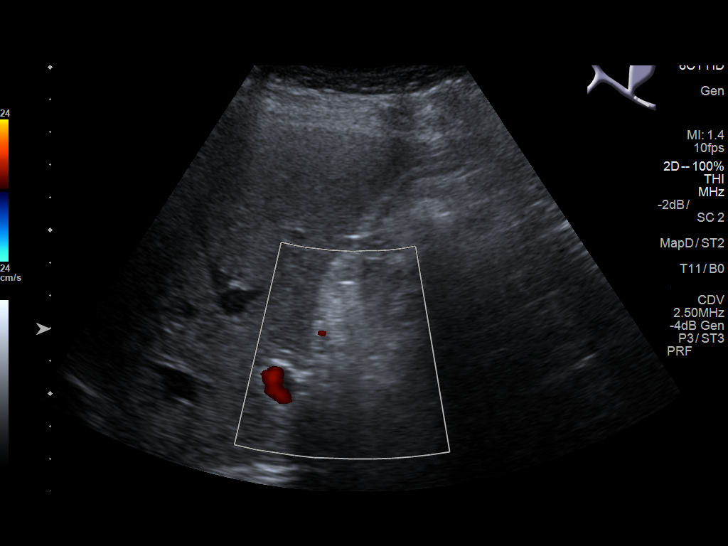
[im 16/48]
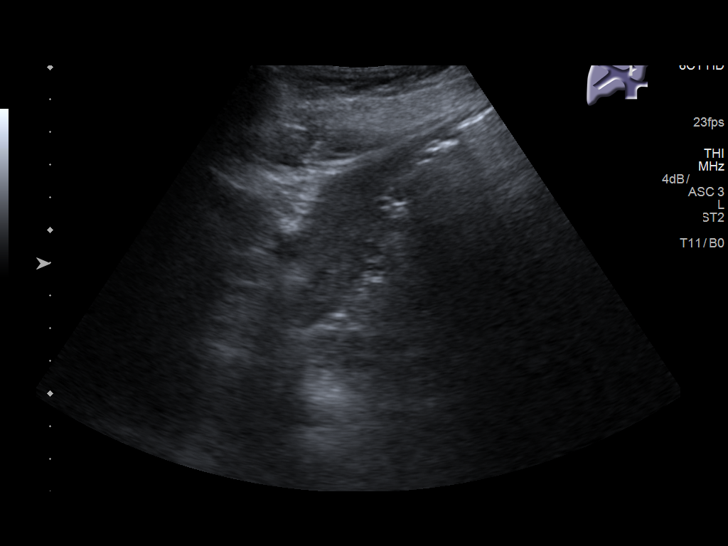
[im 18/48]
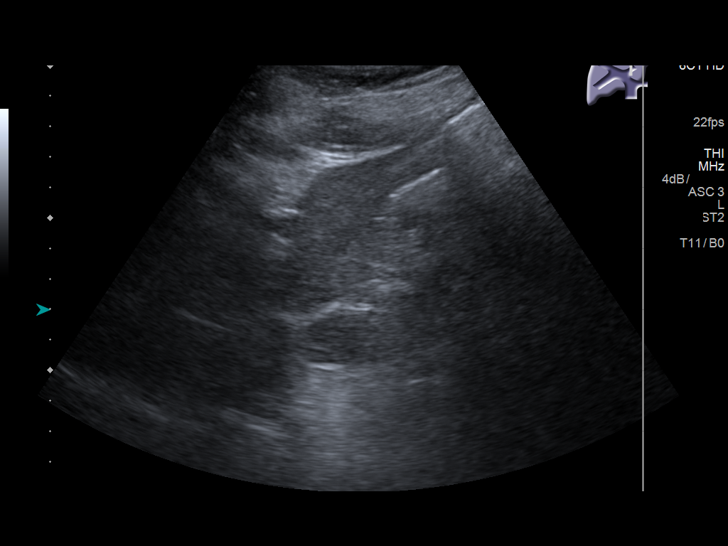
[im 22/48]
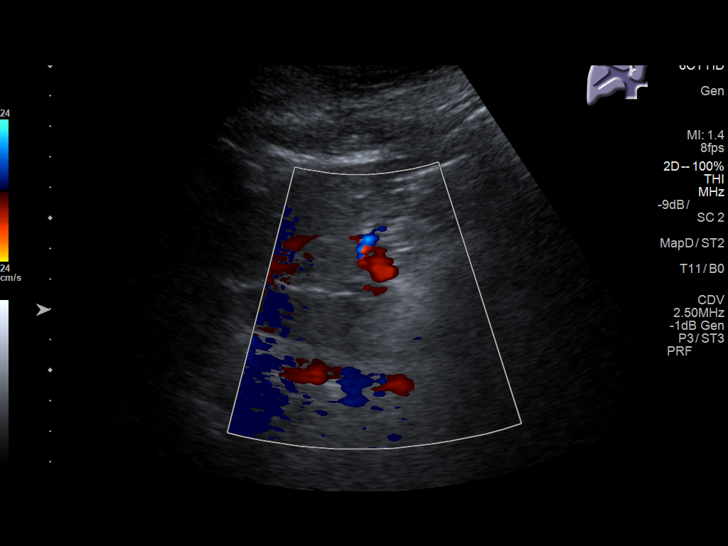
[im 26/48]
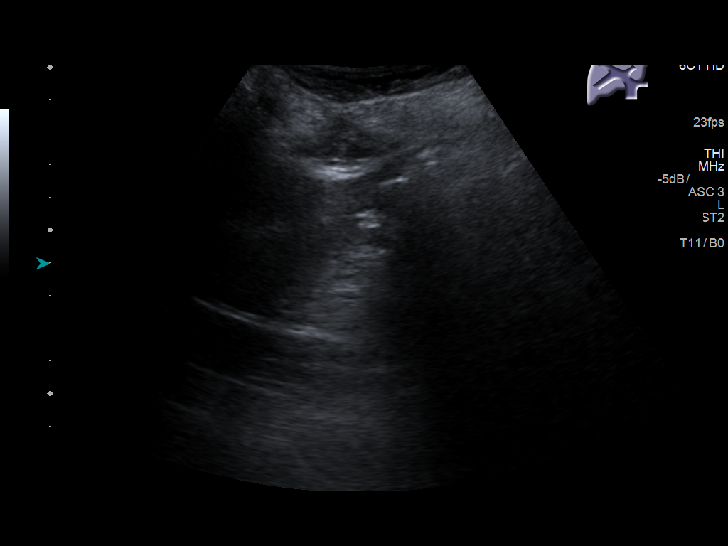
[im 30/48]
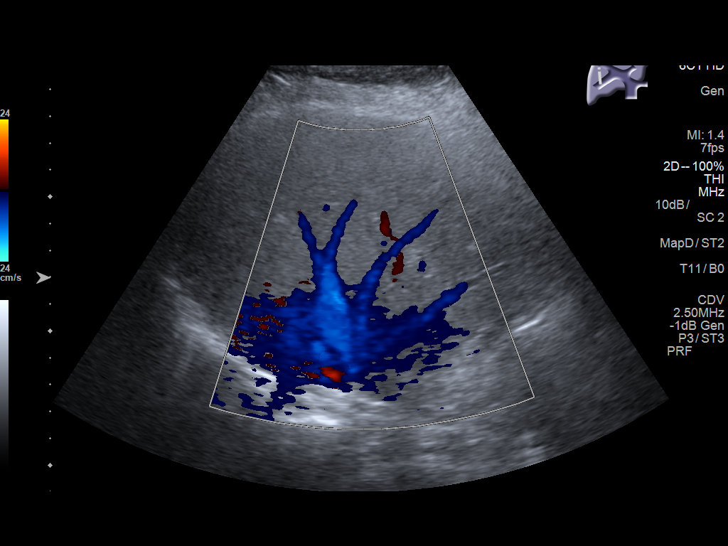
[im 32/48]
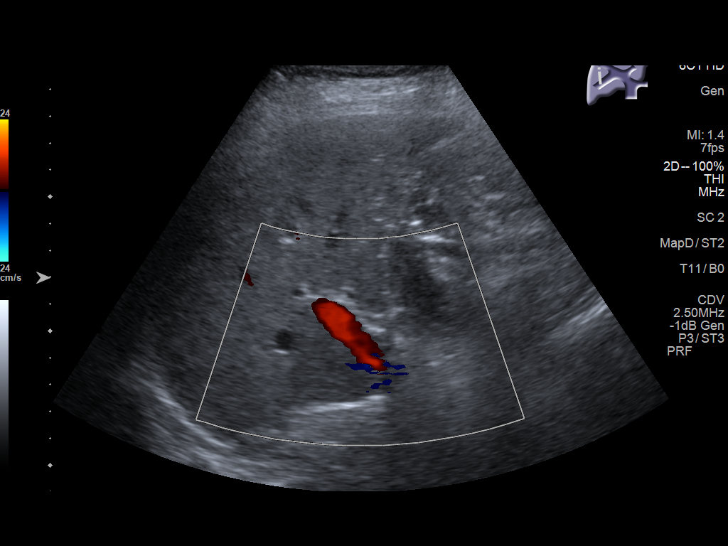
[im 36/48]
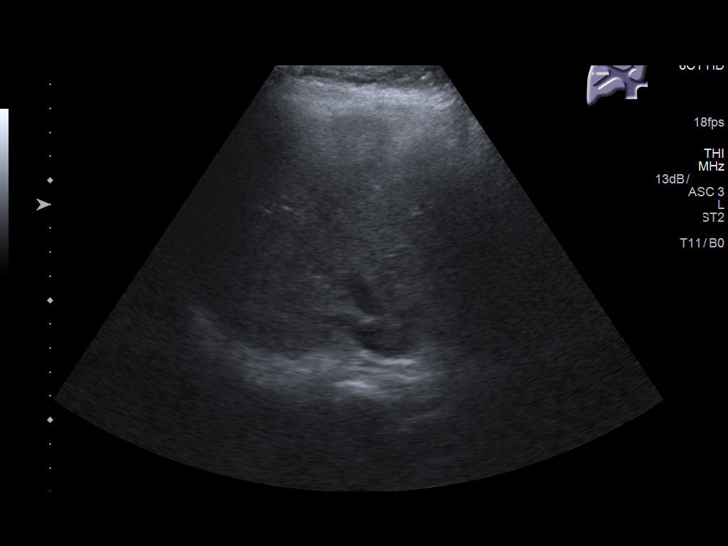
[im 40/48]
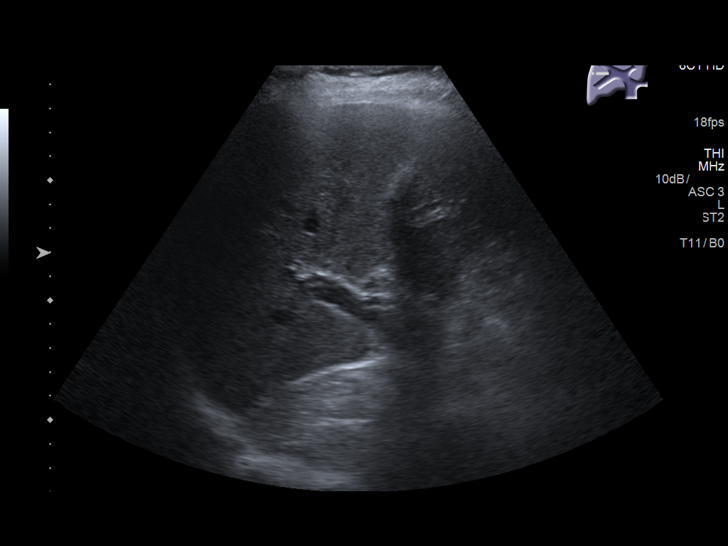
[im 44/48]
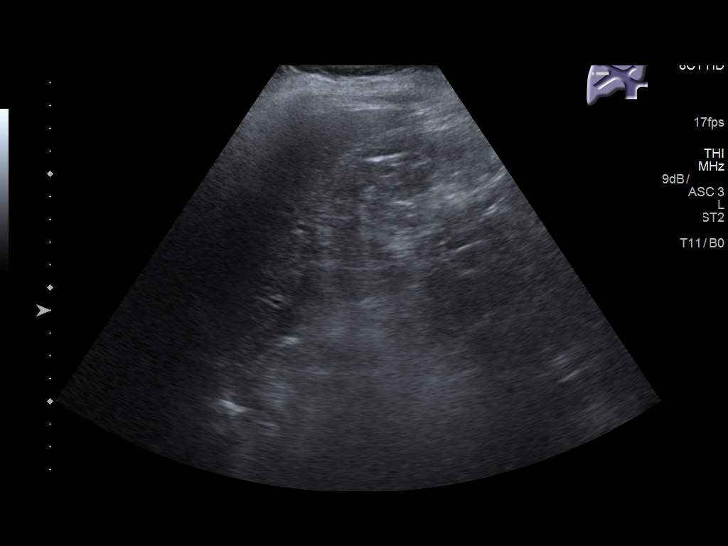
[im 48/48]
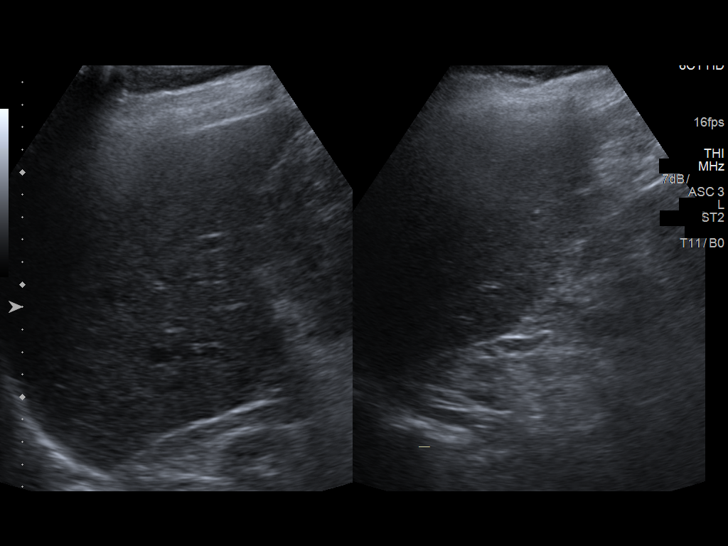

[14 of 25 positions shown; findings below may reference images not displayed]

FINDINGS: Gallbladder:

Gallbladder appears to be partially contracted. No significant
gallbladder wall thickening or pericholecystic fluid is noted. No
sonographic Murphy's sign is noted. No cholelithiasis is noted.

Common bile duct:

Diameter: 3 mm which is within normal limits.

Liver:

No focal lesion identified. Increased echogenicity of hepatic
parenchyma is noted. Portal vein is patent on color Doppler imaging
with normal direction of blood flow towards the liver.
IMPRESSION: No significant abnormality seen involving the gallbladder. Increased
echogenicity of hepatic parenchyma is noted suggesting fatty
infiltration.

## 2019-10-16 IMAGING — DX DG ABDOMEN 1V
1 series · 2 of 2 positions shown · non-contrast
Comparison: Ultrasound of the right upper quadrant of 11/27/2017,
ultrasound of the abdomen of 03/25/2017 and CT abdomen pelvis of
01/24/2013

CLINICAL DATA: Right upper quadrant pain, diffuse abdominal pain

EXAM:
ABDOMEN - 1 VIEW

[Series 1: abdomen supine · 0.14mm/px · 2 of 2 slices shown]
[im 1/2]
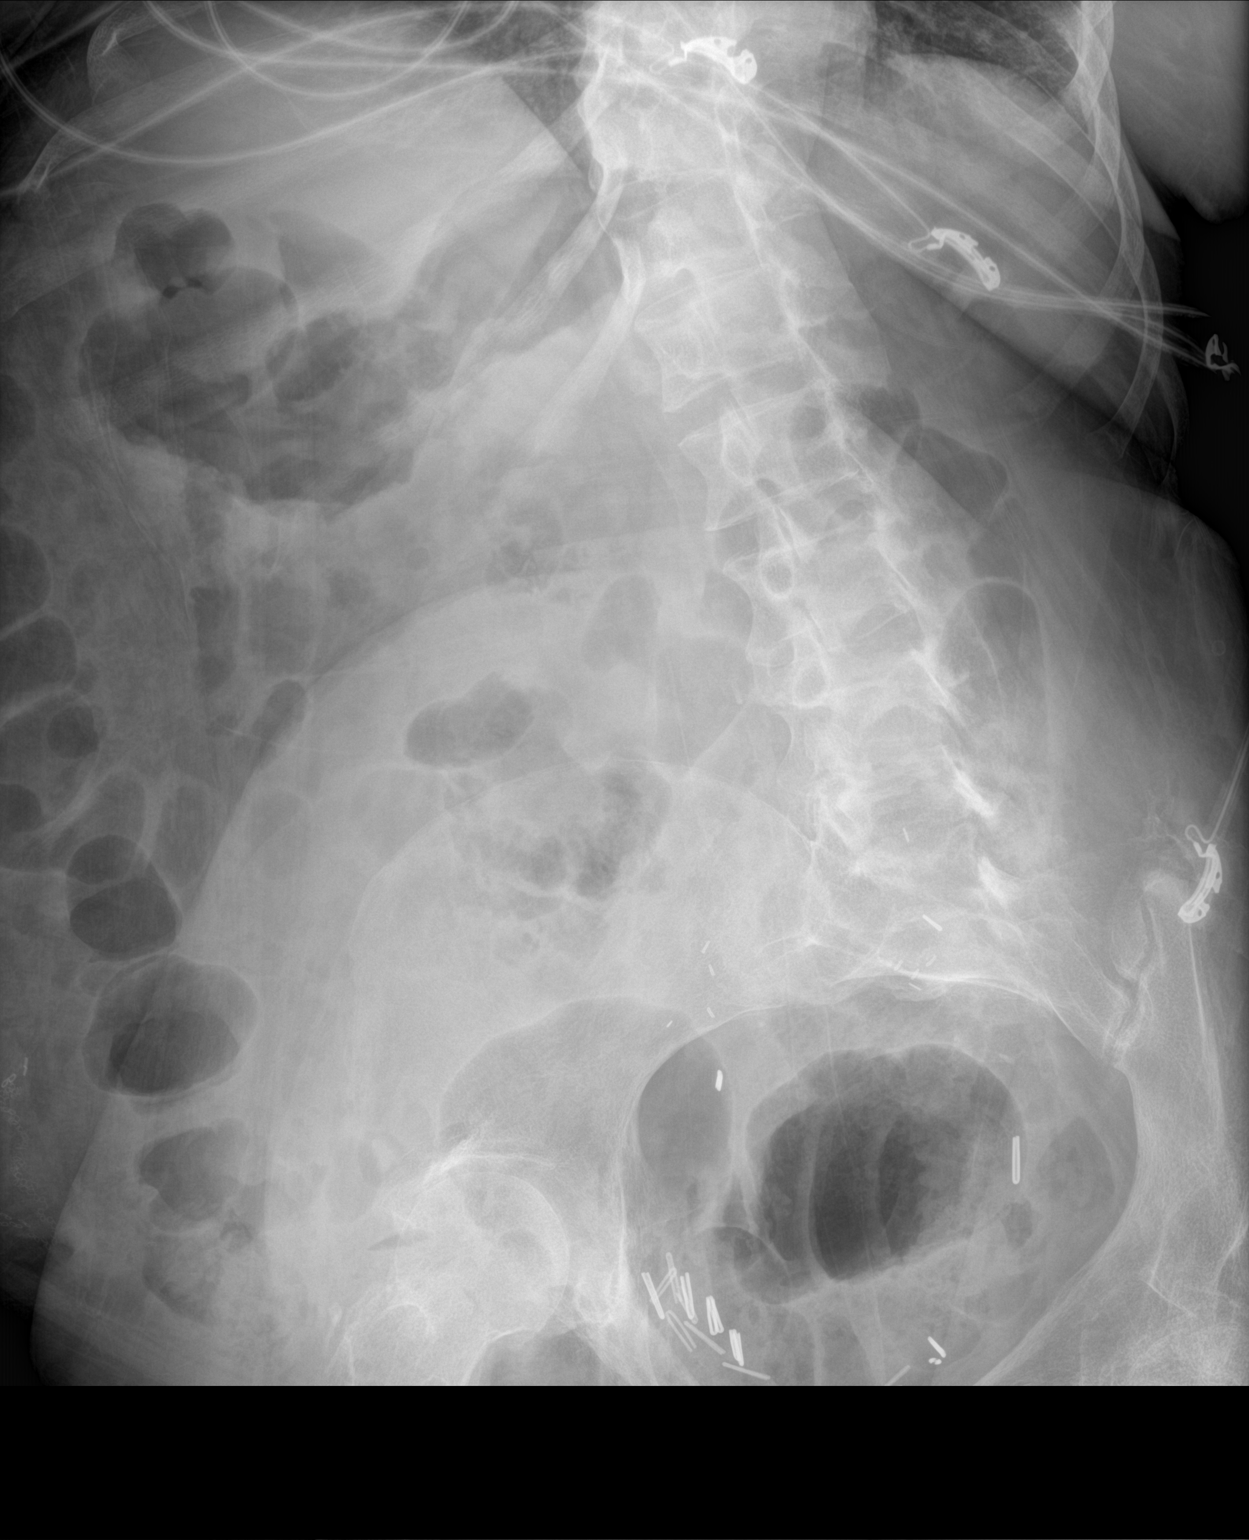
[im 2/2]
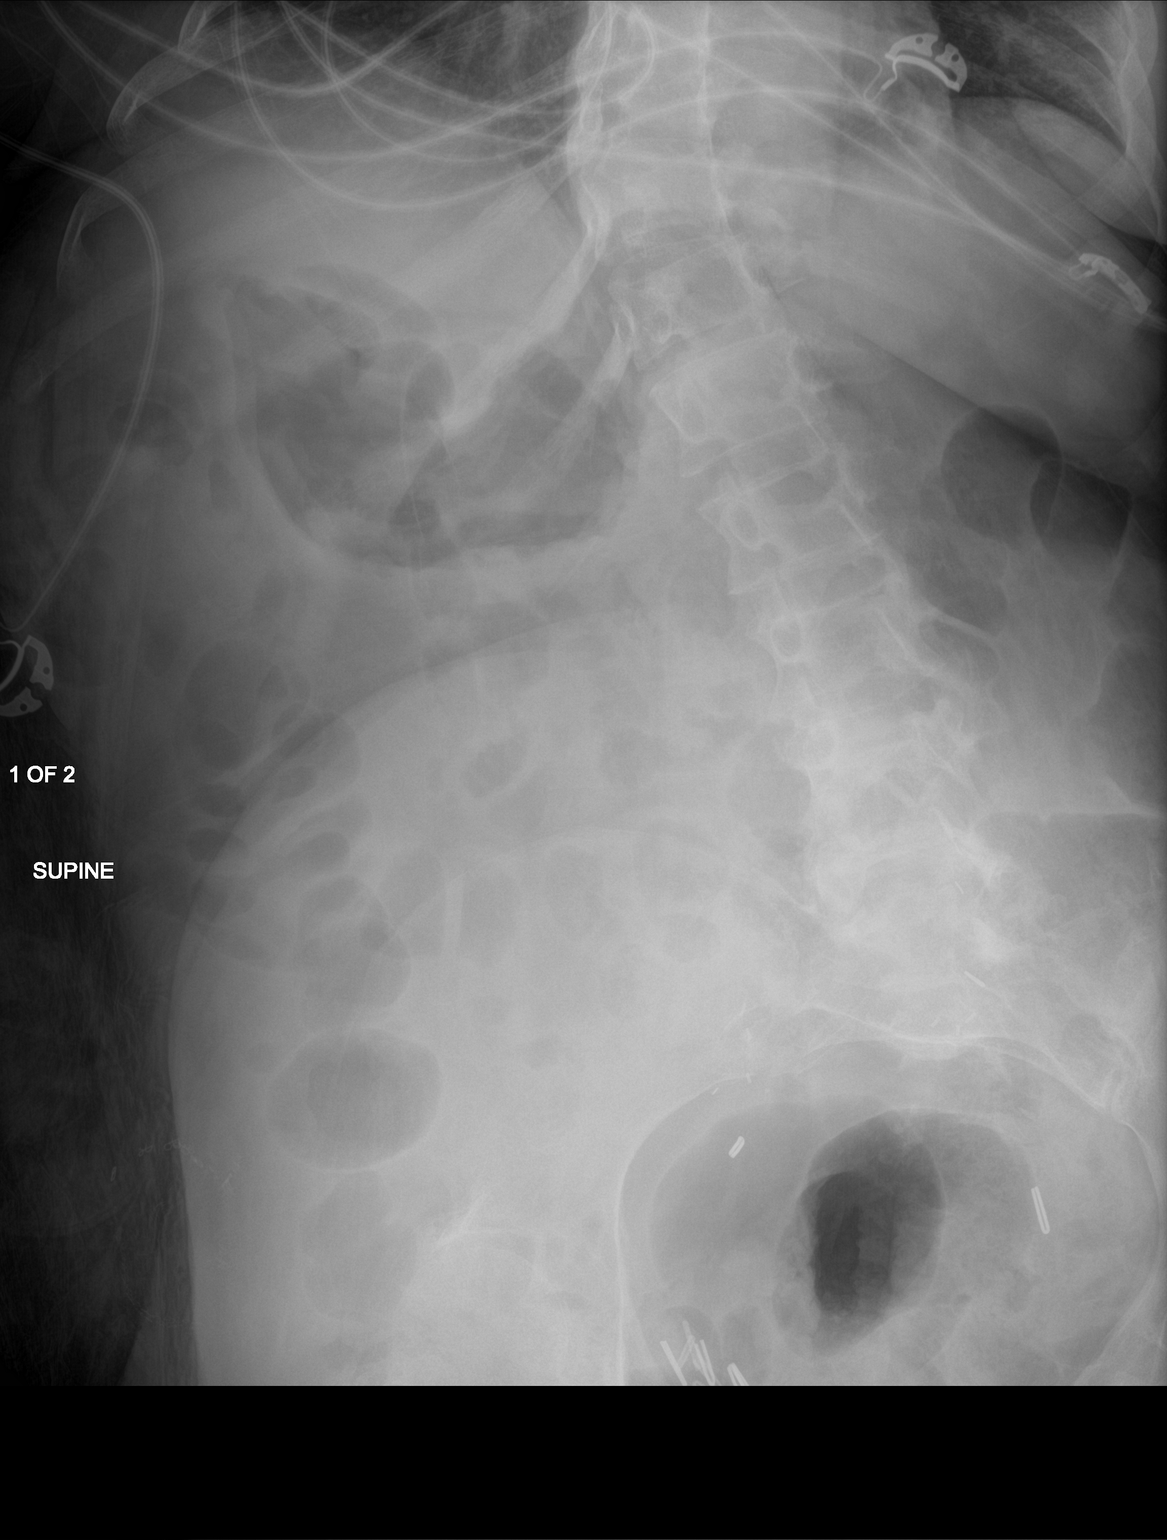

[2 of 2 positions shown; findings below may reference images not displayed]

FINDINGS: Supine film of the abdomen shows both large and small bowel gas to
be present. No bowel obstruction is seen. The patient is somewhat
rotated on the images obtained. There are degenerative changes in
the lower lumbar spine and in both hips. Surgical clips are noted
within the pelvis.
IMPRESSION: No bowel obstruction.
